# Patient Record
Sex: Female | Born: 1955
Health system: Southern US, Community
[De-identification: ages and names within clinical notes are randomized; demographics above are authoritative.]

## PROBLEM LIST (undated history)

## (undated) DIAGNOSIS — I10 Essential (primary) hypertension: Secondary | ICD-10-CM

## (undated) DIAGNOSIS — E079 Disorder of thyroid, unspecified: Secondary | ICD-10-CM

## (undated) DIAGNOSIS — A498 Other bacterial infections of unspecified site: Secondary | ICD-10-CM

## (undated) DIAGNOSIS — E785 Hyperlipidemia, unspecified: Secondary | ICD-10-CM

## (undated) HISTORY — DX: Other bacterial infections of unspecified site: A49.8

## (undated) HISTORY — DX: Essential (primary) hypertension: I10

## (undated) HISTORY — DX: Disorder of thyroid, unspecified: E07.9

## (undated) HISTORY — PX: ANKLE FRACTURE SURGERY: SHX122

## (undated) HISTORY — PX: CERVICAL CONE BIOPSY: SUR198

## (undated) HISTORY — DX: Hyperlipidemia, unspecified: E78.5

---

## 1997-08-19 ENCOUNTER — Other Ambulatory Visit: Admission: RE | Admit: 1997-08-19 | Discharge: 1997-08-19 | Payer: Self-pay | Admitting: Obstetrics & Gynecology

## 1998-02-10 ENCOUNTER — Other Ambulatory Visit: Admission: RE | Admit: 1998-02-10 | Discharge: 1998-02-10 | Payer: Self-pay | Admitting: Obstetrics & Gynecology

## 1998-08-12 ENCOUNTER — Other Ambulatory Visit: Admission: RE | Admit: 1998-08-12 | Discharge: 1998-08-12 | Payer: Self-pay | Admitting: Obstetrics & Gynecology

## 2000-12-13 ENCOUNTER — Other Ambulatory Visit: Admission: RE | Admit: 2000-12-13 | Discharge: 2000-12-13 | Payer: Self-pay | Admitting: Obstetrics & Gynecology

## 2003-03-06 ENCOUNTER — Other Ambulatory Visit: Admission: RE | Admit: 2003-03-06 | Discharge: 2003-03-06 | Payer: Self-pay | Admitting: Obstetrics & Gynecology

## 2004-06-01 ENCOUNTER — Other Ambulatory Visit: Admission: RE | Admit: 2004-06-01 | Discharge: 2004-06-01 | Payer: Self-pay | Admitting: Obstetrics & Gynecology

## 2005-08-22 ENCOUNTER — Ambulatory Visit: Payer: Self-pay | Admitting: Gastroenterology

## 2006-11-06 ENCOUNTER — Ambulatory Visit: Payer: Self-pay | Admitting: Gastroenterology

## 2007-08-18 DIAGNOSIS — E039 Hypothyroidism, unspecified: Secondary | ICD-10-CM

## 2007-08-18 DIAGNOSIS — F32A Depression, unspecified: Secondary | ICD-10-CM | POA: Insufficient documentation

## 2007-08-18 DIAGNOSIS — F329 Major depressive disorder, single episode, unspecified: Secondary | ICD-10-CM

## 2007-08-18 DIAGNOSIS — K219 Gastro-esophageal reflux disease without esophagitis: Secondary | ICD-10-CM

## 2007-10-30 ENCOUNTER — Telehealth: Payer: Self-pay | Admitting: Gastroenterology

## 2008-01-25 ENCOUNTER — Telehealth: Payer: Self-pay | Admitting: Gastroenterology

## 2008-02-25 ENCOUNTER — Ambulatory Visit: Payer: Self-pay | Admitting: Gastroenterology

## 2008-04-23 ENCOUNTER — Ambulatory Visit: Payer: Self-pay | Admitting: Gastroenterology

## 2009-12-22 ENCOUNTER — Encounter: Admission: RE | Admit: 2009-12-22 | Discharge: 2009-12-22 | Payer: Self-pay | Admitting: Orthopedic Surgery

## 2009-12-30 ENCOUNTER — Encounter
Admission: RE | Admit: 2009-12-30 | Discharge: 2010-03-30 | Payer: Self-pay | Source: Home / Self Care | Admitting: Orthopedic Surgery

## 2010-01-05 ENCOUNTER — Encounter: Admission: RE | Admit: 2010-01-05 | Discharge: 2010-01-05 | Payer: Self-pay | Admitting: Orthopedic Surgery

## 2010-01-19 ENCOUNTER — Encounter: Admission: RE | Admit: 2010-01-19 | Discharge: 2010-01-19 | Payer: Self-pay | Admitting: Orthopedic Surgery

## 2010-02-02 ENCOUNTER — Encounter: Admission: RE | Admit: 2010-02-02 | Discharge: 2010-02-02 | Payer: Self-pay | Admitting: Orthopedic Surgery

## 2010-03-03 ENCOUNTER — Encounter: Admission: RE | Admit: 2010-03-03 | Discharge: 2010-03-03 | Payer: Self-pay | Admitting: Orthopedic Surgery

## 2010-03-31 ENCOUNTER — Encounter
Admission: RE | Admit: 2010-03-31 | Discharge: 2010-04-29 | Payer: Self-pay | Source: Home / Self Care | Attending: Orthopedic Surgery | Admitting: Orthopedic Surgery

## 2010-04-06 ENCOUNTER — Encounter: Admission: RE | Admit: 2010-04-06 | Discharge: 2010-04-06 | Payer: Self-pay | Admitting: Orthopedic Surgery

## 2010-05-11 ENCOUNTER — Encounter
Admission: RE | Admit: 2010-05-11 | Discharge: 2010-05-31 | Payer: Self-pay | Source: Home / Self Care | Attending: Orthopedic Surgery | Admitting: Orthopedic Surgery

## 2010-05-23 ENCOUNTER — Encounter: Payer: Self-pay | Admitting: Orthopedic Surgery

## 2010-06-08 ENCOUNTER — Other Ambulatory Visit: Payer: Self-pay | Admitting: Orthopedic Surgery

## 2010-06-08 ENCOUNTER — Ambulatory Visit
Admission: RE | Admit: 2010-06-08 | Discharge: 2010-06-08 | Disposition: A | Discharge status: Home / Self Care | Payer: 59 | Source: Ambulatory Visit | Attending: Orthopedic Surgery | Admitting: Orthopedic Surgery

## 2010-06-08 DIAGNOSIS — S82892A Other fracture of left lower leg, initial encounter for closed fracture: Secondary | ICD-10-CM

## 2010-09-14 NOTE — Assessment & Plan Note (Signed)
[facility]                         GASTROENTEROLOGY OFFICE NOTE   LOAN, OGUIN                        MRN:          [id number]  DATE:11/06/2006                            DOB:          1955-05-29    Mrs. Mehrer returns for followup of GERD.  She states her reflux symptoms  have been under excellent control on Prevacid.  She recently ran out of  Prevacid and changed to Prilosec over the counter and since that times  she has had some mild problems with recurrent hoarseness.  She has no  dysphagia, odynophagia, chest pain, heartburn, belching or weight loss.  Further more she denies any change in bowel habits, change in stool  caliber, diarrhea, constipation, melena or hematochezia.  There is no  family history of colon cancer, colon polyps or inflammatory bowel  disease.   CURRENT MEDICATIONS:  Listed on the chart, updated and reviewed.   MEDICATION ALLERGIES:  None known.   EXAMINATION:  Well-developed, well-nourished, no acute distress.  Weight 152.8 pounds, blood pressure 120/70, pulse 64 and regular.  CHEST:  Clear to auscultation bilaterally.  CARDIAC:  Regular rate and rhythm without murmurs.  ABDOMEN:  Soft and nontender with normal active bowel sounds, no  palpable organomegaly, masses or hernias.   ASSESSMENT/PLAN:  1. Gastroesophageal reflux disease with presumed laryngopharyngeal      reflux (LPR).  Maintain all standard anti reflux measures.  Renew      Prevacid 30 mg p.o. q.a.m. for 1 year.  We did discuss Barrett's      esophagus and other complications of long term gastroesophageal      reflux disease and I have recommended performing upper endoscopy      sometime within the next 3-4 years.  She states she will consider      doing this at the time of her colonoscopy.  2. Colorectal cancer screening.  She is at average risk for colorectal      cancer.  We discussed the risks, benefits and alternatives to      colonoscopy with  possible biopsy and possible polypectomy and      although she does not want to schedule at this time she states that      she will do so within the next year.  She will call back to      reschedule and we will plan to proceed with an upper endoscopy for      Barrett's screening at the time of her colonoscopy.     [name]. [name], MD, [facility]  Electronically Signed    MTS/MedQ  DD: 11/06/2006  DT: 11/06/2006  Job #: [id number]

## 2010-09-24 LAB — HM COLONOSCOPY: HM Colonoscopy: NORMAL

## 2010-11-29 IMAGING — CR DG ANKLE COMPLETE 3+V*L*
3 series · 3 of 3 positions shown · non-contrast
Comparison: February 02, 2010

CLINICAL DATA: Left ankle fracture in October 2009 with surgery and
continued pain

LEFT ANKLE COMPLETE - 3+ VIEW

[view not recorded (1 of 3)]
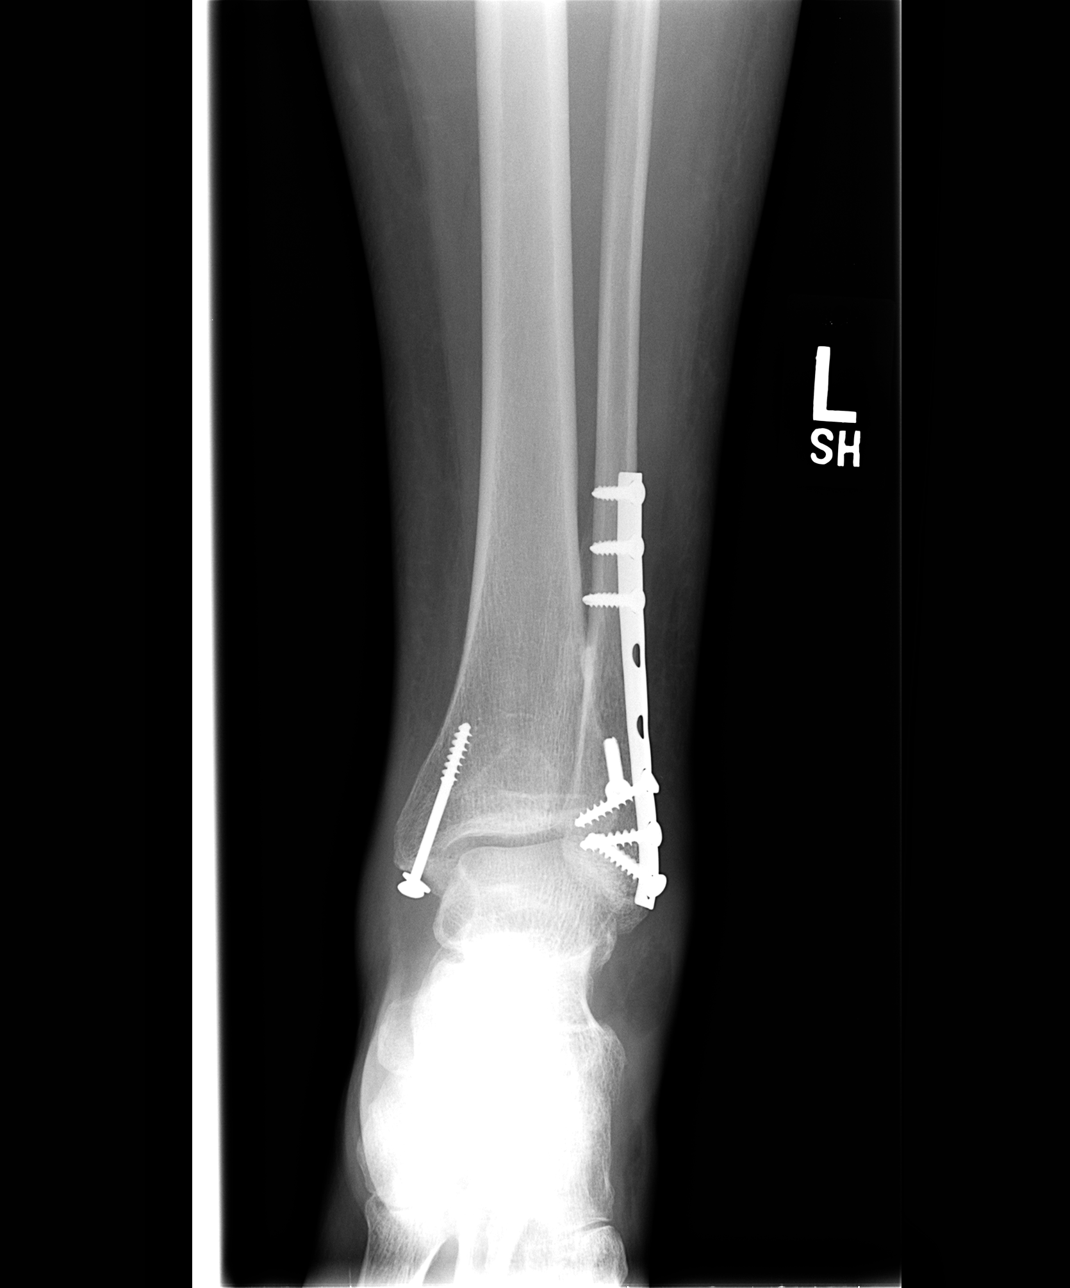

[view not recorded (2 of 3)]
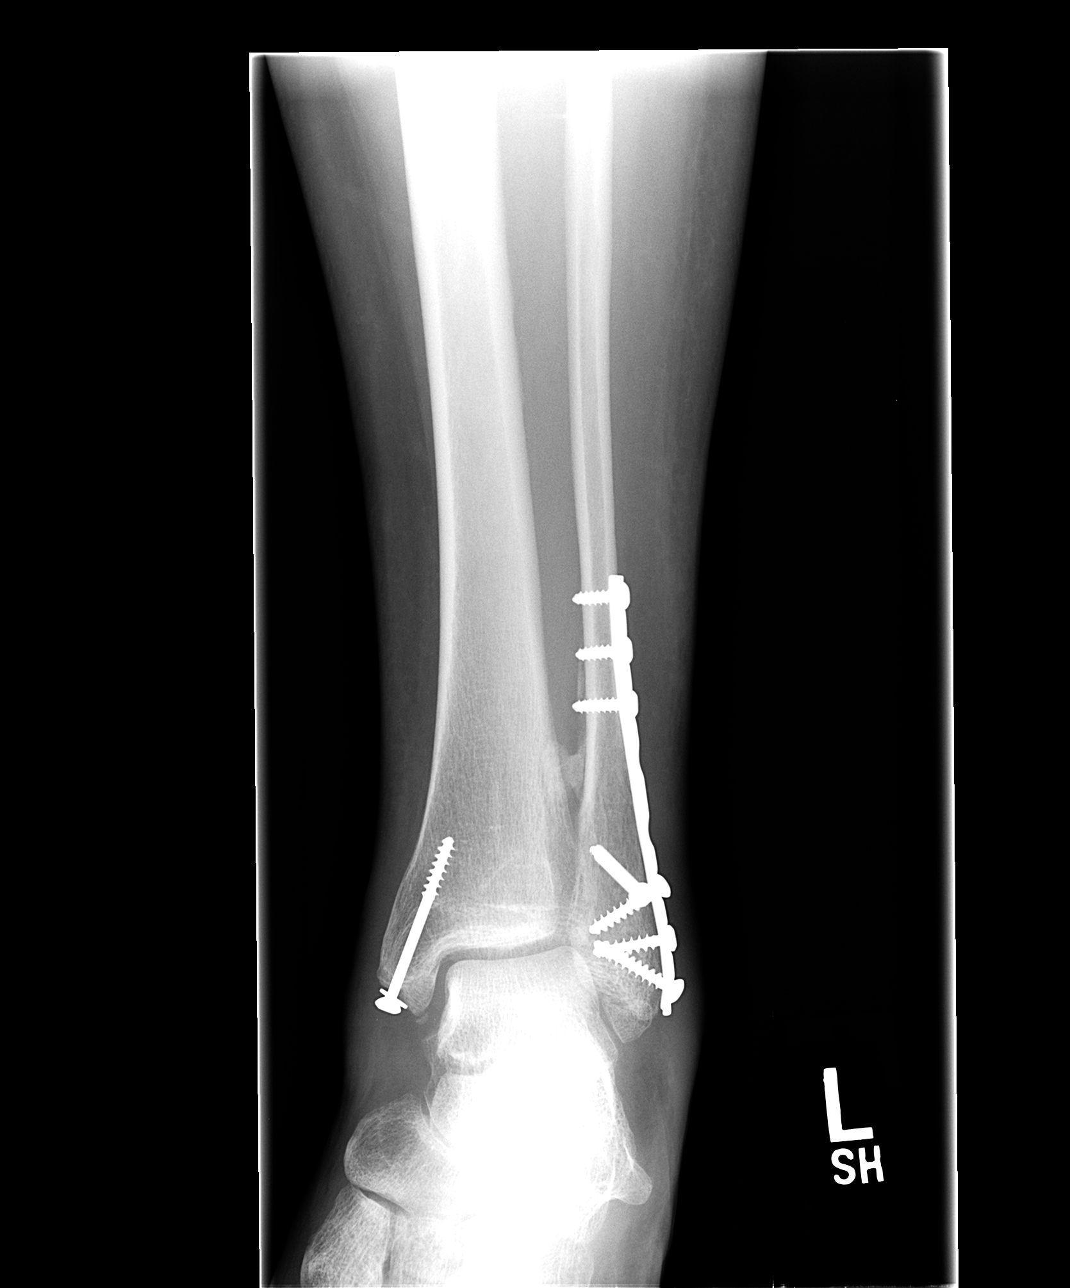

[view not recorded (3 of 3)]
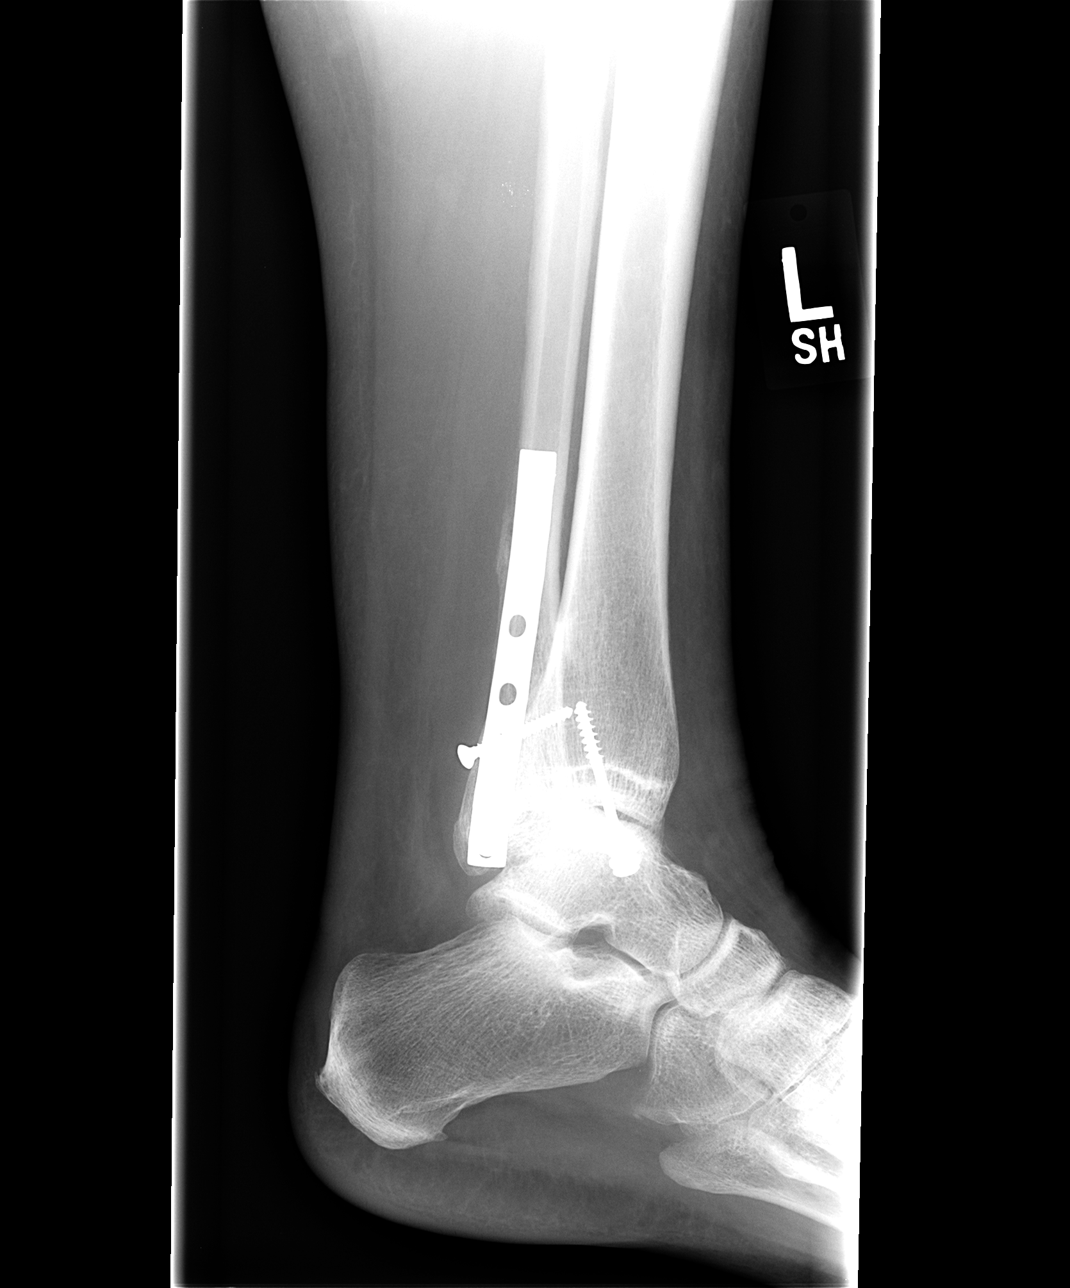

[3 of 3 positions shown; findings below may reference images not displayed]

FINDINGS: There is side plate and screw fixation of the distal left
tibia and long lag screw at the lateral malleolus.  There is no
peri hardware lucency to suggest loosening or infection.  The ankle
mortise is intact.  The fracture sites are anatomically aligned.
IMPRESSION: Stable appearance of ankle hardware and anatomic alignment of
tibial and fibular fractures.  There is no evidence of hardware
complication.

## 2010-12-21 ENCOUNTER — Ambulatory Visit
Admission: RE | Admit: 2010-12-21 | Discharge: 2010-12-21 | Disposition: A | Discharge status: Home / Self Care | Payer: 59 | Source: Ambulatory Visit | Attending: Orthopedic Surgery | Admitting: Orthopedic Surgery

## 2010-12-21 ENCOUNTER — Other Ambulatory Visit: Payer: Self-pay | Admitting: Orthopedic Surgery

## 2010-12-21 DIAGNOSIS — M25572 Pain in left ankle and joints of left foot: Secondary | ICD-10-CM

## 2011-04-15 ENCOUNTER — Ambulatory Visit: Payer: 59 | Admitting: Physical Therapy

## 2011-04-19 ENCOUNTER — Ambulatory Visit: Payer: 59 | Attending: Orthopedic Surgery | Admitting: Physical Therapy

## 2011-04-19 DIAGNOSIS — IMO0001 Reserved for inherently not codable concepts without codable children: Secondary | ICD-10-CM | POA: Insufficient documentation

## 2011-04-19 DIAGNOSIS — R262 Difficulty in walking, not elsewhere classified: Secondary | ICD-10-CM | POA: Insufficient documentation

## 2011-04-19 DIAGNOSIS — M25676 Stiffness of unspecified foot, not elsewhere classified: Secondary | ICD-10-CM | POA: Insufficient documentation

## 2011-04-19 DIAGNOSIS — M25673 Stiffness of unspecified ankle, not elsewhere classified: Secondary | ICD-10-CM | POA: Insufficient documentation

## 2011-04-19 DIAGNOSIS — M25579 Pain in unspecified ankle and joints of unspecified foot: Secondary | ICD-10-CM | POA: Insufficient documentation

## 2011-04-21 ENCOUNTER — Ambulatory Visit: Payer: 59 | Admitting: Physical Therapy

## 2011-04-27 ENCOUNTER — Ambulatory Visit: Payer: 59 | Admitting: Physical Therapy

## 2011-04-29 ENCOUNTER — Ambulatory Visit: Payer: 59 | Admitting: Physical Therapy

## 2011-05-02 ENCOUNTER — Ambulatory Visit: Payer: 59 | Admitting: Physical Therapy

## 2011-05-06 ENCOUNTER — Ambulatory Visit: Payer: 59 | Attending: Orthopedic Surgery | Admitting: Physical Therapy

## 2011-05-06 DIAGNOSIS — M25676 Stiffness of unspecified foot, not elsewhere classified: Secondary | ICD-10-CM | POA: Insufficient documentation

## 2011-05-06 DIAGNOSIS — IMO0001 Reserved for inherently not codable concepts without codable children: Secondary | ICD-10-CM | POA: Insufficient documentation

## 2011-05-06 DIAGNOSIS — M25673 Stiffness of unspecified ankle, not elsewhere classified: Secondary | ICD-10-CM | POA: Insufficient documentation

## 2011-05-06 DIAGNOSIS — M25579 Pain in unspecified ankle and joints of unspecified foot: Secondary | ICD-10-CM | POA: Insufficient documentation

## 2011-05-06 DIAGNOSIS — R262 Difficulty in walking, not elsewhere classified: Secondary | ICD-10-CM | POA: Insufficient documentation

## 2014-01-22 ENCOUNTER — Telehealth: Payer: Self-pay | Admitting: Cardiovascular Disease

## 2014-01-22 NOTE — Telephone Encounter (Signed)
Closed encounter °

## 2014-02-27 ENCOUNTER — Ambulatory Visit (INDEPENDENT_AMBULATORY_CARE_PROVIDER_SITE_OTHER): Payer: BC Managed Care – PPO | Admitting: Cardiovascular Disease

## 2014-02-27 ENCOUNTER — Encounter: Payer: Self-pay | Admitting: Cardiovascular Disease

## 2014-02-27 VITALS — BP 132/82 | HR 63 | Resp 16 | Ht 67.0 in | Wt 151.9 lb

## 2014-02-27 DIAGNOSIS — R0989 Other specified symptoms and signs involving the circulatory and respiratory systems: Secondary | ICD-10-CM

## 2014-02-27 DIAGNOSIS — E7800 Pure hypercholesterolemia, unspecified: Secondary | ICD-10-CM | POA: Insufficient documentation

## 2014-02-27 DIAGNOSIS — R0602 Shortness of breath: Secondary | ICD-10-CM

## 2014-02-27 DIAGNOSIS — R6889 Other general symptoms and signs: Secondary | ICD-10-CM

## 2014-02-27 NOTE — Assessment & Plan Note (Signed)
We'll check a treadmill stress test. This could be an angina equivalent, although I suspect it is more likely related to recent issues with thyroid supplementation, rather than coronary disease.

## 2014-02-27 NOTE — Assessment & Plan Note (Signed)
Her hypercholesterolemia is quite severe and I believe should be treated. She has elevated LDL particle number as well as elevated total LDL and has a family history of coronary disease. She is clearly biased against taking statins. She has never taken these medications and therefore has not had true side effects.  She is already eating a very healthy diet and exercises frequently. She is a healthy weight. I doubt that she would make a significant impact on her lipid profile with additional lifestyle changes. She probably has inherited heterozygous hypercholesterolemia.  Identifying any location of atherosclerotic disease would definitely lead to a firm recommendation for statin therapy and will also alter the target LDL level.

## 2014-02-27 NOTE — Progress Notes (Signed)
Patient ID: Kathleen [name], female   DOB: 19-Sep-1955, 58 y.o.   MRN: [id number]      Reason for office visit Hypercholesterolemia, reduced exercise tolerance  Kathleen [name] is a 58 year old woman without known cardiac or vascular disease but with long-standing problems with hypercholesterolemia and borderline diabetes mellitus. She has treated hypothyroidism with some recent difficulty in identifying the she has a family history of premature death from coronary disease. Her father died at age 57 from a myocardial infarction. Recently she has noticed reduced tolerance to exertion. She habitually walks almost daily but has noticed heaviness in her legs and reduced stamina. She has low energy. She has shortened her he walks from 3-4 miles down to only about 2 miles. She has noticed that she has horizontal earlobe creases, just like her father. She has not had any chest pain either at rest or with exertion and denies palpitations, syncope, intermittent claudication, focal neurological deficits or lower extremity edema. Her most recent TSH was normal.  She has known for a long time that she has an adverse lipid profile but has been resistant to taking statins. Total cholesterol 263, LDL 180, HDL 53, triglycerides 148, small LDL particle 698. Most recent hemoglobin A1c 5.8%. Creatinine 0.74, BUN 6, potassium 3.9, normal liver function tests. These labs are dated 01/21/2014  No Known Allergies  Current Outpatient Prescriptions  Medication Sig Dispense Refill  . ALPRAZolam (XANAX) 0.5 MG tablet Take 0.5 mg by mouth as needed.       . Cholecalciferol (VITAMIN D3) 5000 UNITS CAPS Take 5,000 Units by mouth daily.      Marland Kitchen. estradiol (CLIMARA - DOSED IN MG/24 HR) 0.075 mg/24hr patch       . levothyroxine (SYNTHROID, LEVOTHROID) 50 MCG tablet Take 50 mcg by mouth daily.       Marland Kitchen. NATURE-THROID 97.5 MG TABS daily.       . Omega-3 Fatty Acids (OMEGA-3 FISH OIL CONCENTRATE PO) Take 400 mg by mouth 2 (two) times daily.       . Probiotic Product (PROBIOTIC DAILY PO) Take by mouth 2 (two) times daily.      Marland Kitchen. PROGESTERONE MICRONIZED TD Take 200 mg by mouth daily.      . sertraline (ZOLOFT) 100 MG tablet Take 100 mg by mouth daily. 1 1/2 tablet daily       No current facility-administered medications for this visit.    Past Medical History  Diagnosis Date  . Hyperlipidemia   . Thyroid disease     Past Surgical History  Procedure Laterality Date  . Cervical cone biopsy    . Ankle fracture surgery      Family History  Problem Relation Age of Onset  . Heart disease Father     History   Social History  . Marital Status: Married    Spouse Name: N/A    Number of Children: N/A  . Years of Education: N/A   Occupational History  . Not on file.   Social History Main Topics  . Smoking status: Never Smoker   . Smokeless tobacco: Not on file  . Alcohol Use: No  . Drug Use: No  . Sexual Activity: Not on file   Other Topics Concern  . Not on file   Social History Narrative  . No narrative on file    Review of systems: The patient specifically denies any chest pain at rest or with exertion, dyspnea at rest or with exertion, orthopnea, paroxysmal nocturnal dyspnea, syncope, palpitations, focal neurological  deficits, intermittent claudication, lower extremity edema, unexplained weight gain, cough, hemoptysis or wheezing.  The patient also denies abdominal pain, nausea, vomiting, dysphagia, diarrhea, constipation, polyuria, polydipsia, dysuria, hematuria, frequency, urgency, abnormal bleeding or bruising, fever, chills, unexpected weight changes, mood swings, change in skin or hair texture, change in voice quality, auditory or visual problems, allergic reactions or rashes, new musculoskeletal complaints other than usual "aches and pains".   PHYSICAL EXAM BP 132/82  Pulse 63  Resp 16  Ht 5\' 7"  (1.702 m)  Wt 151 lb 14.4 oz (68.901 kg)  BMI 23.79 kg/m2  General: Alert, oriented x3, no  distress Head: no evidence of trauma, PERRL, EOMI, no exophtalmos or lid lag, no myxedema, no xanthelasma; normal ears, nose and oropharynx Neck: normal jugular venous pulsations and no hepatojugular reflux; brisk carotid pulses without delay she has a very distinct left carotid bruit but does not have a bruit on the right Chest: clear to auscultation, no signs of consolidation by percussion or palpation, normal fremitus, symmetrical and full respiratory excursions Cardiovascular: normal position and quality of the apical impulse, regular rhythm, normal first and second heart sounds, no murmurs, rubs or gallops Abdomen: no tenderness or distention, no masses by palpation, no abnormal pulsatility or arterial bruits, normal bowel sounds, no hepatosplenomegaly Extremities: no clubbing, cyanosis or edema; 2+ radial, ulnar and brachial pulses bilaterally; 2+ right femoral, posterior tibial and dorsalis pedis pulses; 2+ left femoral, posterior tibial and dorsalis pedis pulses; no subclavian or femoral bruits Neurological: grossly nonfocal   EKG: Normal sinus rhythm, normal tracing  Lipid Panel  Total cholesterol 263, LDL 180, HDL 53, triglycerides 148, small LDL particle 698. Most recent hemoglobin A1c 5.8%. Creatinine 0.74, BUN 6, potassium 3.9, normal liver function tests. These labs are dated 01/21/2014   ASSESSMENT AND PLAN  Orders Placed This Encounter  Procedures  . EKG 12-Lead  . Exercise Tolerance Test  . Doppler carotid   Meds ordered this encounter  Medications  . ALPRAZolam (XANAX) 0.5 MG tablet    Sig: Take 0.5 mg by mouth as needed.   Marland Kitchen. estradiol (CLIMARA - DOSED IN MG/24 HR) 0.075 mg/24hr patch    Sig:   . levothyroxine (SYNTHROID, LEVOTHROID) 50 MCG tablet    Sig: Take 50 mcg by mouth daily.   Marland Kitchen. NATURE-THROID 97.5 MG TABS    Sig: daily.   . sertraline (ZOLOFT) 100 MG tablet    Sig: Take 100 mg by mouth daily. 1 1/2 tablet daily  . PROGESTERONE MICRONIZED TD    Sig: Take  200 mg by mouth daily.  . Omega-3 Fatty Acids (OMEGA-3 FISH OIL CONCENTRATE PO)    Sig: Take 400 mg by mouth 2 (two) times daily.  . Probiotic Product (PROBIOTIC DAILY PO)    Sig: Take by mouth 2 (two) times daily.  . Cholecalciferol (VITAMIN D3) 5000 UNITS CAPS    Sig: Take 5,000 Units by mouth daily.    Junious SilkROITORU,Rheta Hemmelgarn  Tinley Rought, MD, [facility] [phone] office [phone] pager

## 2014-02-27 NOTE — Patient Instructions (Signed)
Your physician has requested that you have a carotid duplex. This test is an ultrasound of the carotid arteries in your neck. It looks at blood flow through these arteries that supply the brain with blood. Allow one hour for this exam. There are no restrictions or special instructions.  Your physician has requested that you have an exercise tolerance test. For further information please visit [email or web address]. Please also follow instruction sheet, as given.  Dr. Royann Shiversroitoru recommends that you schedule a follow-up appointment in: One year.

## 2014-03-18 ENCOUNTER — Telehealth (HOSPITAL_COMMUNITY): Payer: Self-pay

## 2014-03-18 NOTE — Telephone Encounter (Signed)
Encounter complete. 

## 2014-03-20 ENCOUNTER — Ambulatory Visit (HOSPITAL_COMMUNITY)
Admission: RE | Admit: 2014-03-20 | Discharge: 2014-03-20 | Disposition: A | Discharge status: Home / Self Care | Payer: BC Managed Care – PPO | Source: Ambulatory Visit | Attending: Cardiology | Admitting: Cardiology

## 2014-03-20 ENCOUNTER — Ambulatory Visit (HOSPITAL_BASED_OUTPATIENT_CLINIC_OR_DEPARTMENT_OTHER)
Admission: RE | Admit: 2014-03-20 | Discharge: 2014-03-20 | Disposition: A | Discharge status: Home / Self Care | Payer: BC Managed Care – PPO | Source: Ambulatory Visit | Attending: Cardiovascular Disease | Admitting: Cardiovascular Disease

## 2014-03-20 DIAGNOSIS — R0989 Other specified symptoms and signs involving the circulatory and respiratory systems: Secondary | ICD-10-CM | POA: Diagnosis present

## 2014-03-20 DIAGNOSIS — R0602 Shortness of breath: Secondary | ICD-10-CM

## 2014-03-20 NOTE — Procedures (Signed)
Exercise Treadmill Test Test  Exercise Tolerance Test Ordering MD: Thurmon FairMihai Croitoru, MD    Unique Test No: 1  Treadmill:  1  Indication for ETT: fatigue  Contraindication to ETT: No   Stress Modality: exercise - treadmill  Cardiac Imaging Performed: non   Protocol: standard Bruce - maximal  Max BP:  179/81  Max MPHR (bpm):  163 85% MPR (bpm):  139  MPHR obtained (bpm):  160 % MPHR obtained:  98  Reached 85% MPHR (min:sec):  8 Total Exercise Time (min-sec):  10  Workload in METS:  11.7 Borg Scale: 15  Reason ETT Terminated:  fatigue    ST Segment Analysis At Rest: NSR, RAD,no ST changes With Exercise: no evidence of significant ST depression  Other Information Arrhythmia:  No Angina during ETT:  absent (0) Quality of ETT:  diagnostic  ETT Interpretation:  normal - no evidence of ischemia by ST analysis  Comments: ETT with good exercise tolerance (10:00 min); normal BP response; no chest pain; no ST changes; negative adequate ETT.  Olga MillersBrian Crenshaw

## 2014-03-20 NOTE — Progress Notes (Signed)
Carotid Duplex Completed. °Brianna L Mazza,RVT °

## 2014-03-25 ENCOUNTER — Telehealth: Payer: Self-pay | Admitting: Cardiovascular Disease

## 2014-03-25 NOTE — Telephone Encounter (Signed)
Pt. Informed about her doppler results 

## 2014-03-25 NOTE — Telephone Encounter (Signed)
Pt was calling in wanting to know the results of her Carotid doppler done on 11/19. Please call  Thanks

## 2014-04-02 ENCOUNTER — Other Ambulatory Visit: Payer: Self-pay | Admitting: Obstetrics & Gynecology

## 2014-04-03 LAB — CYTOLOGY - PAP

## 2014-10-27 ENCOUNTER — Other Ambulatory Visit: Payer: Self-pay | Admitting: Obstetrics & Gynecology

## 2014-10-28 LAB — CYTOLOGY - PAP

## 2015-01-31 LAB — HM PAP SMEAR

## 2015-01-31 LAB — HM MAMMOGRAPHY: HM Mammogram: NORMAL (ref 0–4)

## 2015-03-20 ENCOUNTER — Encounter: Payer: Self-pay | Admitting: Cardiovascular Disease

## 2015-03-20 ENCOUNTER — Ambulatory Visit (INDEPENDENT_AMBULATORY_CARE_PROVIDER_SITE_OTHER): Payer: BLUE CROSS/BLUE SHIELD | Admitting: Cardiovascular Disease

## 2015-03-20 VITALS — BP 142/78 | HR 61 | Resp 16 | Ht 66.5 in | Wt 157.9 lb

## 2015-03-20 DIAGNOSIS — E7800 Pure hypercholesterolemia, unspecified: Secondary | ICD-10-CM | POA: Diagnosis not present

## 2015-03-20 DIAGNOSIS — R0989 Other specified symptoms and signs involving the circulatory and respiratory systems: Secondary | ICD-10-CM

## 2015-03-20 NOTE — Progress Notes (Signed)
Patient ID: Kathleen [name], female   DOB: January 24, 1956, 59 y.o.   MRN: [id number]     Cardiology Office Note   Date:  03/21/2015   ID:  Kathleen [name], DOB 04/25/1956, MRN [id number]  PCP:  Londell Moh, MD  Cardiologist:   Thurmon Fair, MD   Chief Complaint  Patient presents with  . Annual Exam    NO CARDIAC COMPLAINTS.  PCP wants her to start Crestor but she wants to discuss first.      History of Present Illness: Kathleen [name] is a 59 y.o. female who presents for  Follow-up for hyperlipidemia and family history of premature vascular disease. 3 of her sisters have fairly severely elevated LDL, as does Kathleen [name] , consistent with familial heterozygous hypercholesterolemia. She does not have any symptoms of cardiac disease. Dr. Renne Crigler prescribed Crestor 5 mg daily, but Kathleen [name] has not started taking it yet. She remains physically active and exercises regularly. She tries to eat a healthy diet.   a year ago, she exercised for 10 minutes on the Bruce protocol stress test, without symptoms or ECG changes. She has an asymptomatic left carotid bruit, Doppler ultrasonography showed only mild plaque.  Past Medical History  Diagnosis Date  . Hyperlipidemia   . Thyroid disease     Past Surgical History  Procedure Laterality Date  . Cervical cone biopsy    . Ankle fracture surgery       Current Outpatient Prescriptions  Medication Sig Dispense Refill  . ALPRAZolam (XANAX) 0.5 MG tablet Take 0.5 mg by mouth as needed.     Marland Kitchen aspirin 81 MG tablet Take 81 mg by mouth daily.    . Cholecalciferol (VITAMIN D3) 5000 UNITS CAPS Take 5,000 Units by mouth daily.    Marland Kitchen estradiol (CLIMARA - DOSED IN MG/24 HR) 0.075 mg/24hr patch Place 0.075 mg onto the skin 2 (two) times a week.     . estradiol (VIVELLE-DOT) 0.075 MG/24HR Place 1 patch onto the skin 2 (two) times a week.  1  . levothyroxine (SYNTHROID, LEVOTHROID) 75 MCG tablet Take 75 mcg by mouth daily.  1  . metFORMIN (GLUCOPHAGE) 500 MG  tablet Take 500 mg by mouth 2 (two) times daily with a meal.    . NATURE-THROID 97.5 MG TABS daily.     . Omega-3 Fatty Acids (OMEGA-3 FISH OIL CONCENTRATE PO) Take 400 mg by mouth 2 (two) times daily.    . Probiotic Product (PROBIOTIC DAILY PO) Take by mouth 2 (two) times daily.    Marland Kitchen PROGESTERONE MICRONIZED TD Take 200 mg by mouth daily.    . sertraline (ZOLOFT) 100 MG tablet Take 100 mg by mouth daily. 1 1/2 tablet daily     No current facility-administered medications for this visit.    Allergies:   Review of patient's allergies indicates no known allergies.    Social History:  The patient  reports that she has never smoked. She does not have any smokeless tobacco history on file. She reports that she does not drink alcohol or use illicit drugs.   Family History:  The patient's family history includes Deep vein thrombosis in her sister; Heart disease in her father.    ROS:  Please see the history of present illness.    Otherwise, review of systems positive for none.   All other systems are reviewed and negative.    PHYSICAL EXAM: VS:  BP 142/78 mmHg  Pulse 61  Resp 16  Ht 5' 6.5" (1.689 m)  Wt 157 lb  14.4 oz (71.623 kg)  BMI 25.11 kg/m2 , BMI Body mass index is 25.11 kg/(m^2).  General: Alert, oriented x3, no distress Head: no evidence of trauma, PERRL, EOMI, no exophtalmos or lid lag, no myxedema, no xanthelasma; normal ears, nose and oropharynx Neck: normal jugular venous pulsations and no hepatojugular reflux; brisk carotid pulses with faint left carotid bruit Chest: clear to auscultation, no signs of consolidation by percussion or palpation, normal fremitus, symmetrical and full respiratory excursions Cardiovascular: normal position and quality of the apical impulse, regular rhythm, normal first and second heart sounds, no murmurs, rubs or gallops Abdomen: no tenderness or distention, no masses by palpation, no abnormal pulsatility or arterial bruits, normal bowel sounds,  no hepatosplenomegaly Extremities: no clubbing, cyanosis or edema; 2+ radial, ulnar and brachial pulses bilaterally; 2+ right femoral, posterior tibial and dorsalis pedis pulses; 2+ left femoral, posterior tibial and dorsalis pedis pulses; no subclavian or femoral bruits Neurological: grossly nonfocal Psych: euthymic mood, full affect   EKG:  EKG is ordered today. The ekg ordered today demonstrates  Normal sinus rhythm, normal tracing, QTC 402 ms   Recent Labs:  labs drawn 02/19/2015  TSH 0.4, hemoglobin 13.1, creatinine 0.7, normal electrolytes and liver function tests  Lipid Panel  cholesterol 259, LDL 193, HDL 50, triglycerides 80  Wt Readings from Last 3 Encounters:  03/20/15 157 lb 14.4 oz (71.623 kg)  02/27/14 151 lb 14.4 oz (68.901 kg)  02/25/08 156 lb (70.761 kg)    .   ASSESSMENT AND PLAN:   Kathleen [name] has severely elevated LDL cholesterol despite regular physical exercise , healthy diet and weight close to the desirable range.   she has several questions about the purpose of statin therapy and potential side effects. We discussed this in detail. Also went over several supplements that had been recommended by her sister. I agree with Dr. Carolee RotaPharr's recommendation that she start treatment with a statin.  She will start doing so.  She understands that this does not give her license to stop exercising and eating healthy diet, but that these interventions are complementary.    Current medicines are reviewed at length with the patient today.  The patient has concerns regarding medicines.  The following changes have been made:   Start Crestor 5 mg daily as prescribed by Dr. Renne CriglerPharr  Labs/ tests ordered today include:  No orders of the defined types were placed in this encounter.     Patient Instructions  Dr. Royann Shiversroitoru recommends that you schedule a follow-up appointment in: ONE YEAR       SignedThurmon Fair, Yenesis Even, MD  03/21/2015 11:49 AM    Thurmon FairMihai Klein Willcox, MD, [facility] [phone] office [phone] pager

## 2015-03-20 NOTE — Patient Instructions (Signed)
Dr. Croitoru recommends that you schedule a follow-up appointment in: ONE YEAR   

## 2015-03-21 ENCOUNTER — Encounter: Payer: Self-pay | Admitting: Cardiovascular Disease

## 2015-08-24 DIAGNOSIS — Z08 Encounter for follow-up examination after completed treatment for malignant neoplasm: Secondary | ICD-10-CM | POA: Diagnosis not present

## 2015-08-24 DIAGNOSIS — Z85828 Personal history of other malignant neoplasm of skin: Secondary | ICD-10-CM | POA: Diagnosis not present

## 2015-09-24 ENCOUNTER — Encounter: Payer: Self-pay | Admitting: Family Medicine

## 2015-09-24 ENCOUNTER — Ambulatory Visit (INDEPENDENT_AMBULATORY_CARE_PROVIDER_SITE_OTHER): Payer: BLUE CROSS/BLUE SHIELD | Admitting: Family Medicine

## 2015-09-24 VITALS — BP 121/78 | HR 72 | Temp 98.2°F | Resp 16 | Ht 67.0 in | Wt 140.4 lb

## 2015-09-24 DIAGNOSIS — A047 Enterocolitis due to Clostridium difficile: Secondary | ICD-10-CM | POA: Diagnosis not present

## 2015-09-24 DIAGNOSIS — E7800 Pure hypercholesterolemia, unspecified: Secondary | ICD-10-CM | POA: Diagnosis not present

## 2015-09-24 DIAGNOSIS — F32A Depression, unspecified: Secondary | ICD-10-CM

## 2015-09-24 DIAGNOSIS — F329 Major depressive disorder, single episode, unspecified: Secondary | ICD-10-CM

## 2015-09-24 DIAGNOSIS — E038 Other specified hypothyroidism: Secondary | ICD-10-CM

## 2015-09-24 DIAGNOSIS — A0472 Enterocolitis due to Clostridium difficile, not specified as recurrent: Secondary | ICD-10-CM

## 2015-09-24 LAB — BASIC METABOLIC PANEL
BUN: 9 mg/dL (ref 6–23)
CHLORIDE: 101 meq/L (ref 96–112)
CO2: 27 mEq/L (ref 19–32)
Calcium: 9.6 mg/dL (ref 8.4–10.5)
Creatinine, Ser: 0.61 mg/dL (ref 0.40–1.20)
GFR: 106.61 mL/min (ref >60.00)
GLUCOSE: 86 mg/dL (ref 70–99)
POTASSIUM: 3.8 meq/L (ref 3.5–5.1)
SODIUM: 136 meq/L (ref 135–145)

## 2015-09-24 LAB — HEPATIC FUNCTION PANEL
ALBUMIN: 4.6 g/dL (ref 3.5–5.2)
ALT: 9 U/L (ref 0–35)
AST: 11 U/L (ref 0–37)
Alkaline Phosphatase: 55 U/L (ref 39–117)
Bilirubin, Direct: 0.1 mg/dL (ref 0.0–0.3)
Total Bilirubin: 0.4 mg/dL (ref 0.2–1.2)
Total Protein: 6.8 g/dL (ref 6.0–8.3)

## 2015-09-24 LAB — LIPID PANEL
CHOLESTEROL: 230 mg/dL — AB (ref 0–200)
HDL: 47 mg/dL (ref >39.00)
LDL CALC: 162 mg/dL — AB (ref 0–99)
NONHDL: 182.94
Total CHOL/HDL Ratio: 5
Triglycerides: 106 mg/dL (ref 0.0–149.0)
VLDL: 21.2 mg/dL (ref 0.0–40.0)

## 2015-09-24 LAB — TSH: TSH: 0.45 u[IU]/mL (ref 0.35–4.50)

## 2015-09-24 LAB — T4, FREE: Free T4: 0.75 ng/dL (ref 0.60–1.60)

## 2015-09-24 LAB — T3, FREE: T3 FREE: 3 pg/mL (ref 2.3–4.2)

## 2015-09-24 NOTE — Patient Instructions (Signed)
Schedule your complete physical in 6 months We'll notify you of your lab results and make any changes if needed Keep up the good work on healthy diet and regular exercise- you look great! Call with any questions or concerns Welcome!  We're glad to have you! Happy Memorial Day!

## 2015-09-24 NOTE — Progress Notes (Signed)
   Subjective:    Patient ID: Kathleen [name], female    DOB: [birth year], 60 y.o.   MRN: [id number]  HPI New to establish.  Previous MD- Pharr   GYN- Kathleen [name]  GI- Kathleen [name], changing to Dr Kathleen [name]  Hypothyroid- chronic problem, on Nature Thyroid [facility] and Levothyroxine.  Last TSH done 01/2015.  Denies excessive fatigue, changes to skin/hair/nails.  Hyperlipidemia- LDL was 193 in October, on Omega 3 twice daily but not on statin prescribed.  Pt is attempting to control w/ healthy diet and regular exercise.  Lost 15 lbs w/ recent C diff infxn.  Denies abd pain, N/V, myalgias.  + family hx of CAD.  Depression- chronic problem, on Zoloft 150mg  daily.  Pt feels sxs are well controlled, see Kathleen [name]   C diff- infxn x2.  Completed 2 courses of Flagyl.  Has new pt appt w/ Dr Kathleen [name].  Hx of underlying IBS.     Review of Systems  For ROS see HPI     Objective:   Physical Exam  Constitutional: She is oriented to person, place, and time. She appears well-developed and well-nourished. No distress.  HENT:  Head: Normocephalic and atraumatic.  Eyes: Conjunctivae and EOM are normal. Pupils are equal, round, and reactive to light.  Neck: Normal range of motion. Neck supple. No thyromegaly present.  Cardiovascular: Normal rate, regular rhythm, normal heart sounds and intact distal pulses.   No murmur heard. Pulmonary/Chest: Effort normal and breath sounds normal. No respiratory distress.  Abdominal: Soft. She exhibits no distension. There is no tenderness.  Musculoskeletal: She exhibits no edema.  Lymphadenopathy:    She has no cervical adenopathy.  Neurological: She is alert and oriented to person, place, and time.  Skin: Skin is warm and dry.  Psychiatric: She has a normal mood and affect. Her behavior is normal.  Vitals reviewed.         Assessment & Plan:

## 2015-09-24 NOTE — Progress Notes (Signed)
Pre visit review using our clinic review tool, if applicable. No additional management support is needed unless otherwise documented below in the visit note. 

## 2015-09-25 ENCOUNTER — Other Ambulatory Visit: Payer: Self-pay | Admitting: Family Medicine

## 2015-09-25 DIAGNOSIS — A0472 Enterocolitis due to Clostridium difficile, not specified as recurrent: Secondary | ICD-10-CM | POA: Insufficient documentation

## 2015-09-25 DIAGNOSIS — E785 Hyperlipidemia, unspecified: Secondary | ICD-10-CM

## 2015-09-25 NOTE — Assessment & Plan Note (Signed)
New to provider, ongoing for pt.  On both levothyroxine and Nature Thyroid.  Pt is currently asymptomatic.  Check labs.  Adjust meds prn.

## 2015-09-25 NOTE — Assessment & Plan Note (Signed)
New to provider, ongoing for pt.  She has had 2 separate episodes and completed 2 courses of Flagyl.  Has appt upcoming w/ GI.  Reports sxs have resolved at this time.  Will continue to monitor.

## 2015-09-25 NOTE — Assessment & Plan Note (Signed)
New to provider.  Ongoing for pt.  Seeing Ellis SavageLisa Poulos.  Feels sxs are currently well controlled.  Will follow along and assist as able.

## 2015-09-25 NOTE — Assessment & Plan Note (Signed)
New to provider, ongoing for pt.  She was prescribed a statin but elected not to start this as directed and attempt to focus on healthy diet and regular exercise instead.  Check labs.  Start statin if needed.  Pt expressed understanding and is in agreement w/ plan.

## 2015-10-19 ENCOUNTER — Ambulatory Visit (INDEPENDENT_AMBULATORY_CARE_PROVIDER_SITE_OTHER): Payer: BLUE CROSS/BLUE SHIELD | Admitting: Gastroenterology

## 2015-10-19 ENCOUNTER — Encounter: Payer: Self-pay | Admitting: Gastroenterology

## 2015-10-19 VITALS — BP 120/78 | HR 91 | Ht 67.0 in | Wt 138.0 lb

## 2015-10-19 DIAGNOSIS — A0472 Enterocolitis due to Clostridium difficile, not specified as recurrent: Secondary | ICD-10-CM

## 2015-10-19 DIAGNOSIS — A047 Enterocolitis due to Clostridium difficile: Secondary | ICD-10-CM

## 2015-10-19 DIAGNOSIS — K589 Irritable bowel syndrome without diarrhea: Secondary | ICD-10-CM

## 2015-10-19 NOTE — Patient Instructions (Signed)
Watch your intake of raw fruits and vegetables, milk and milk products, and high fat foods that can cause your diarrhea.   Thank you for choosing me and [facility].  Venita LickMalcolm T. Pleas KochStark, Jr., MD., Clementeen GrahamFACG

## 2015-10-19 NOTE — Progress Notes (Signed)
History of Present Illness: This is a 60 year old female referred by Sheliah Hatchabori, Katherine E, MD for the evaluation of recent C. difficile diarrhea. Patient reports the onset of severe diarrhea associated with nausea and weight loss following a course of clindamycin. C. diff toxin was negative and C diff pcr was positive. She was treated with a 10 day course of Flagyl and her symptoms resolved however all symptoms returned within a few weeks and she was treated with another 10 day course of Flagyl. Over the course of this time she lost about 20 pounds. Her diarrhea resolved and her bowel movements have been normal except when she eats raw fruits, raw vegetables, high fat foods and she notes stools that are more urgent and loose. She has a history of irritable bowel syndrome in the past with  urgent morning loose stools. The symptoms have been mild and she has not required medications. She was previously evaluated by me in 2009 and then switched her care to Dr. Ritta SlotJeff Medoff and underwent colonoscopy in December 2013. She reports the only finding was internal hemorrhoids which were treated by Dr. Kinnie ScalesMedoff. We are requesting records. Denies abdominal pain, constipation, change in stool caliber, melena, hematochezia, nausea, vomiting, dysphagia, reflux symptoms, chest pain.  Allergies  Allergen Reactions  . Clindamycin/Lincomycin Swelling    Gi problems and made throat close up after 7 days   Outpatient Prescriptions Prior to Visit  Medication Sig Dispense Refill  . ALPRAZolam (XANAX) 0.5 MG tablet Take 0.5 mg by mouth as needed.     . Cholecalciferol (VITAMIN D3) 5000 UNITS CAPS Take 5,000 Units by mouth daily. Reported on 09/24/2015    . estradiol (VIVELLE-DOT) 0.075 MG/24HR Place 1 patch onto the skin 2 (two) times a week.  1  . levothyroxine (SYNTHROID, LEVOTHROID) 75 MCG tablet Take 75 mcg by mouth daily.  1  . NATURE-THROID 97.5 MG TABS daily.     . Probiotic Product (PROBIOTIC DAILY PO) Take by mouth  2 (two) times daily.    . progesterone (PROMETRIUM) 100 MG capsule TK 1 C PO QHS  12  . rosuvastatin (CRESTOR) 5 MG tablet Take 5 mg by mouth daily.    Marland Kitchen. saccharomyces boulardii (FLORASTOR) 250 MG capsule Take 250 mg by mouth 2 (two) times daily.    . sertraline (ZOLOFT) 100 MG tablet Take 100 mg by mouth daily. 1 1/2 tablet daily    . aspirin 81 MG tablet Take 81 mg by mouth daily.    . metFORMIN (GLUCOPHAGE) 500 MG tablet Take 500 mg by mouth 2 (two) times daily with a meal. Reported on 09/24/2015    . Omega-3 Fatty Acids (OMEGA-3 FISH OIL CONCENTRATE PO) Take 400 mg by mouth 2 (two) times daily. Reported on 09/24/2015     No facility-administered medications prior to visit.   Past Medical History  Diagnosis Date  . Hyperlipidemia   . Thyroid disease   . Clostridium difficile infection    Past Surgical History  Procedure Laterality Date  . Cervical cone biopsy    . Ankle fracture surgery     Social History   Social History  . Marital Status: Married    Spouse Name: N/A  . Number of Children: N/A  . Years of Education: N/A   Social History Main Topics  . Smoking status: Never Smoker   . Smokeless tobacco: None  . Alcohol Use: No  . Drug Use: No  . Sexual Activity: Not Asked   Other Topics Concern  .  None   Social History Narrative   Family History  Problem Relation Age of Onset  . Heart disease Father   . Deep vein thrombosis Sister       Review of Systems: Pertinent positive and negative review of systems were noted in the above HPI section. All other review of systems were otherwise negative.   Physical Exam: General: Well developed, well nourished, no acute distress Head: Normocephalic and atraumatic Eyes:  sclerae anicteric, EOMI Ears: Normal auditory acuity Mouth: No deformity or lesions Neck: Supple, no masses or thyromegaly Lungs: Clear throughout to auscultation Heart: Regular rate and rhythm; no murmurs, rubs or bruits Abdomen: Soft, non tender and  non distended. No masses, hepatosplenomegaly or hernias noted. Normal Bowel sounds Musculoskeletal: Symmetrical with no gross deformities  Skin: No lesions on visible extremities Pulses:  Normal pulses noted Extremities: No clubbing, cyanosis, edema or deformities noted Neurological: Alert oriented x 4, grossly nonfocal Cervical Nodes:  No significant cervical adenopathy Inguinal Nodes: No significant inguinal adenopathy Psychological:  Alert and cooperative. Normal mood and affect  Assessment and Recommendations:  1. Recent history of relapsing C. difficile diarrhea with clinical resolution following 2 ten day courses of Flagyl. She now has an intolerance to raw fruits, raw vegetables and high fat foods. This is likely a postinfectious process or worsening of her IBS following acute GI infection. She is advised to avoid or minimize raw fruits, raw vegetables, milk products, high fat foods alcohol and caffeine. She may gradually reintroduce these into her diet over the next several months as tolerated. Continue Florastor bid for one more month. Resume Florastor bid during any future courses of antibiotics and continue for at least 1 month following completion of the antibiotic. We discussed treatment with vancomycin or Dificid if she has a relapse/recurrence of C. difficile.  2. CRC screening, average risk. Request records from Dr. Kinnie Scales. Tentatively plan for a 10 year interval surveillance colonoscopy in December 2023.   cc: Sheliah Hatch, MD [address] [place], [place] [address]

## 2015-10-27 DIAGNOSIS — E039 Hypothyroidism, unspecified: Secondary | ICD-10-CM | POA: Diagnosis not present

## 2015-10-27 DIAGNOSIS — E7800 Pure hypercholesterolemia, unspecified: Secondary | ICD-10-CM | POA: Diagnosis not present

## 2015-10-27 DIAGNOSIS — E28310 Symptomatic premature menopause: Secondary | ICD-10-CM | POA: Diagnosis not present

## 2015-10-27 DIAGNOSIS — E559 Vitamin D deficiency, unspecified: Secondary | ICD-10-CM | POA: Diagnosis not present

## 2015-11-09 ENCOUNTER — Other Ambulatory Visit: Payer: BLUE CROSS/BLUE SHIELD

## 2015-11-30 DIAGNOSIS — F3342 Major depressive disorder, recurrent, in full remission: Secondary | ICD-10-CM | POA: Diagnosis not present

## 2016-01-04 DIAGNOSIS — F419 Anxiety disorder, unspecified: Secondary | ICD-10-CM | POA: Diagnosis not present

## 2016-01-04 DIAGNOSIS — E039 Hypothyroidism, unspecified: Secondary | ICD-10-CM | POA: Diagnosis not present

## 2016-01-04 DIAGNOSIS — R197 Diarrhea, unspecified: Secondary | ICD-10-CM | POA: Diagnosis not present

## 2016-01-04 DIAGNOSIS — R112 Nausea with vomiting, unspecified: Secondary | ICD-10-CM | POA: Diagnosis not present

## 2016-01-08 ENCOUNTER — Telehealth: Payer: Self-pay | Admitting: Gastroenterology

## 2016-01-08 ENCOUNTER — Other Ambulatory Visit: Payer: BLUE CROSS/BLUE SHIELD

## 2016-01-08 DIAGNOSIS — R197 Diarrhea, unspecified: Secondary | ICD-10-CM

## 2016-01-08 NOTE — Telephone Encounter (Signed)
GI pathogen panel Please fax us all lab and other information she has from ED visit

## 2016-01-08 NOTE — Telephone Encounter (Signed)
Patient reports that while out of town this weekend at at football game she became violently ill after tail gaiting at the game.  Had several hours of vomiting and diarrhea.  Went to ED.  They diagnosed her with acute gastritis and sent her home after fluids.  She is having continued diarrhea after meals 3-4 times a day.  She reports that stools are semi formed to liquid.  She reports low grade fever.  This am T-max 99.5. She "has done extensive research and feels she has a recurrence of her c-diff and a very, very severe CDI infection"  based on the labs from the ED.  She has an abnormal CBC results from Satutday night /Sunday am available if you would like to see them..  She has not had any additional vomiting since ED discharge.  Please advise next step.  She does not want to be retreated with Vanc.  If she has c-diff she wants Dificid.  "I don't care about the cost".  Dr. Russella DarStark please advise

## 2016-01-08 NOTE — Telephone Encounter (Signed)
Patient notified She will have someone pick up container and bring copies of labs

## 2016-01-11 ENCOUNTER — Other Ambulatory Visit: Payer: BLUE CROSS/BLUE SHIELD

## 2016-01-11 DIAGNOSIS — R197 Diarrhea, unspecified: Secondary | ICD-10-CM | POA: Diagnosis not present

## 2016-01-12 LAB — GASTROINTESTINAL PATHOGEN PANEL PCR
C. difficile Tox A/B, PCR: NOT DETECTED
Campylobacter, PCR: NOT DETECTED
Cryptosporidium, PCR: NOT DETECTED
E COLI (ETEC) LT/ST, PCR: NOT DETECTED
E COLI (STEC) STX1/STX2, PCR: NOT DETECTED
E COLI 0157, PCR: NOT DETECTED
GIARDIA LAMBLIA, PCR: NOT DETECTED
NOROVIRUS, PCR: NOT DETECTED
Rotavirus A, PCR: NOT DETECTED
SALMONELLA, PCR: NOT DETECTED
SHIGELLA, PCR: NOT DETECTED

## 2016-01-15 ENCOUNTER — Ambulatory Visit (INDEPENDENT_AMBULATORY_CARE_PROVIDER_SITE_OTHER): Payer: BLUE CROSS/BLUE SHIELD | Admitting: Family Medicine

## 2016-01-15 ENCOUNTER — Encounter: Payer: Self-pay | Admitting: Family Medicine

## 2016-01-15 VITALS — BP 126/82 | HR 78 | Temp 97.8°F | Resp 16 | Ht 67.0 in | Wt 138.1 lb

## 2016-01-15 DIAGNOSIS — R197 Diarrhea, unspecified: Secondary | ICD-10-CM | POA: Diagnosis not present

## 2016-01-15 LAB — BASIC METABOLIC PANEL
BUN: 6 mg/dL (ref 6–23)
CALCIUM: 9.7 mg/dL (ref 8.4–10.5)
CHLORIDE: 98 meq/L (ref 96–112)
CO2: 26 meq/L (ref 19–32)
CREATININE: 0.7 mg/dL (ref 0.40–1.20)
GFR: 90.86 mL/min (ref >60.00)
GLUCOSE: 112 mg/dL — AB (ref 70–99)
Potassium: 4 mEq/L (ref 3.5–5.1)
Sodium: 136 mEq/L (ref 135–145)

## 2016-01-15 LAB — CBC WITH DIFFERENTIAL/PLATELET
BASOS ABS: 0 10*3/uL (ref 0.0–0.1)
Basophils Relative: 0.2 % (ref 0.0–3.0)
EOS ABS: 0.1 10*3/uL (ref 0.0–0.7)
Eosinophils Relative: 1.3 % (ref 0.0–5.0)
HCT: 42.9 % (ref 36.0–46.0)
Hemoglobin: 14.4 g/dL (ref 12.0–15.0)
LYMPHS ABS: 1.8 10*3/uL (ref 0.7–4.0)
Lymphocytes Relative: 24.3 % (ref 12.0–46.0)
MCHC: 33.7 g/dL (ref 30.0–36.0)
MCV: 91.1 fl (ref 78.0–100.0)
MONOS PCT: 6.9 % (ref 3.0–12.0)
Monocytes Absolute: 0.5 10*3/uL (ref 0.1–1.0)
NEUTROS ABS: 4.9 10*3/uL (ref 1.4–7.7)
NEUTROS PCT: 67.3 % (ref 43.0–77.0)
PLATELETS: 245 10*3/uL (ref 150.0–400.0)
RBC: 4.71 Mil/uL (ref 3.87–5.11)
RDW: 14.4 % (ref 11.5–15.5)
WBC: 7.3 10*3/uL (ref 4.0–10.5)

## 2016-01-15 LAB — HEPATIC FUNCTION PANEL
ALBUMIN: 4.7 g/dL (ref 3.5–5.2)
ALK PHOS: 49 U/L (ref 39–117)
ALT: 13 U/L (ref 0–35)
AST: 14 U/L (ref 0–37)
BILIRUBIN DIRECT: 0.1 mg/dL (ref 0.0–0.3)
TOTAL PROTEIN: 7.2 g/dL (ref 6.0–8.3)
Total Bilirubin: 0.5 mg/dL (ref 0.2–1.2)

## 2016-01-15 NOTE — Progress Notes (Signed)
Pre visit review using our clinic review tool, if applicable. No additional management support is needed unless otherwise documented below in the visit note. 

## 2016-01-15 NOTE — Patient Instructions (Signed)
Follow up as needed We'll notify you of your lab results and make any changes if needed Complete the stool studies as you are able Drink plenty of fluids!!! Start a SU[employer] diet- Bread/Bananas, Rice, Applesauce, Toast.  AVOID dairy as this is very hard for the gut to absorb in this setting Call with any questions or concerns Hang in there!!!

## 2016-01-15 NOTE — Progress Notes (Signed)
   Subjective:    Patient ID: Kathleen [name], female    DOB: [birth year], 60 y.o.   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  Daily diarrhea, no appetite, fatigue, low grade fevers (Tm 100).  Pt had C Diff in June and has had some difficulty normalizing diet since then.  sxs started very quickly, went to ER.  Pt reports up to 4 watery stools daily.  Pt reports sxs are worse after eating- is only eating saltines, ginger ale, rice, yogurt.  Pt is no longer vomiting, has not vomited since 9/3.  No abd pain, denies cramping.  Pt has hx of IBS.  Continues to take probiotic.  Not taking immodium.   Review of Systems For ROS see HPI     Objective:   Physical Exam  Constitutional: She is oriented to person, place, and time. She appears well-developed and well-nourished. No distress.  HENT:  Head: Normocephalic and atraumatic.  MMM  Neck: Neck supple.  Cardiovascular: Normal rate, regular rhythm and intact distal pulses.   Pulmonary/Chest: Effort normal and breath sounds normal. No respiratory distress. She has no wheezes. She has no rales.  Abdominal: Soft. She exhibits no distension. There is no tenderness. There is no rebound.  Hypoactive BS  Lymphadenopathy:    She has no cervical adenopathy.  Neurological: She is alert and oriented to person, place, and time.  Skin: Skin is warm and dry.  Psychiatric:  Pt is very angry today.  Has very negative things to say about all medical providers ([place] in [place], previous MD who started her on Clinda which 'caused' the initial C Diff, GI for not responding to her phone calls)          Assessment & Plan:  Diarrhea- I suspect that given her negative pathogen panel that this was an acute viral gastroenteritis that has now resulted in some malabsorptive diarrhea.  Encouraged pt to avoid dairy- she is currently eating yogurt quite a bit which may be worsening her sxs.  Repeat stool studies as  pt is adamant that this is how she presented when she first had C diff.  Assess electrolytes as these were indicative of dehydration at time of ER visit (low Na, low K+, low Cl).  Reviewed supportive care and red flags that should prompt return.  Pt expressed understanding and is in agreement w/ plan.

## 2016-01-18 ENCOUNTER — Encounter: Payer: Self-pay | Admitting: Family Medicine

## 2016-01-18 ENCOUNTER — Other Ambulatory Visit: Payer: Self-pay | Admitting: Family Medicine

## 2016-01-18 DIAGNOSIS — R197 Diarrhea, unspecified: Secondary | ICD-10-CM | POA: Diagnosis not present

## 2016-01-19 LAB — C. DIFFICILE GDH AND TOXIN A/B
C. DIFF TOXIN A/B: NOT DETECTED
C. DIFFICILE GDH: NOT DETECTED

## 2016-01-21 ENCOUNTER — Telehealth: Payer: Self-pay | Admitting: Family Medicine

## 2016-01-21 NOTE — Telephone Encounter (Signed)
Pt calling for lab results.

## 2016-01-22 LAB — STOOL CULTURE

## 2016-01-22 NOTE — Telephone Encounter (Signed)
Pt was called and message left on home machine per DPR.. Labs were also sent to pt on mychart.

## 2016-02-23 ENCOUNTER — Encounter: Payer: BLUE CROSS/BLUE SHIELD | Admitting: Family Medicine

## 2016-05-30 DIAGNOSIS — F330 Major depressive disorder, recurrent, mild: Secondary | ICD-10-CM | POA: Diagnosis not present

## 2016-05-30 DIAGNOSIS — L821 Other seborrheic keratosis: Secondary | ICD-10-CM | POA: Diagnosis not present

## 2016-07-20 ENCOUNTER — Ambulatory Visit (INDEPENDENT_AMBULATORY_CARE_PROVIDER_SITE_OTHER): Payer: BLUE CROSS/BLUE SHIELD | Admitting: Family Medicine

## 2016-07-20 ENCOUNTER — Encounter: Payer: Self-pay | Admitting: Family Medicine

## 2016-07-20 VITALS — BP 124/80 | HR 101 | Temp 98.1°F | Resp 16 | Ht 67.0 in | Wt 148.5 lb

## 2016-07-20 DIAGNOSIS — E038 Other specified hypothyroidism: Secondary | ICD-10-CM

## 2016-07-20 DIAGNOSIS — Z0000 Encounter for general adult medical examination without abnormal findings: Secondary | ICD-10-CM | POA: Diagnosis not present

## 2016-07-20 LAB — CBC WITH DIFFERENTIAL/PLATELET
Basophils Absolute: 0 10*3/uL (ref 0.0–0.1)
Basophils Relative: 0.6 % (ref 0.0–3.0)
EOS PCT: 1.8 % (ref 0.0–5.0)
Eosinophils Absolute: 0.1 10*3/uL (ref 0.0–0.7)
HCT: 41.6 % (ref 36.0–46.0)
Hemoglobin: 13.8 g/dL (ref 12.0–15.0)
Lymphocytes Relative: 19.2 % (ref 12.0–46.0)
Lymphs Abs: 1.1 10*3/uL (ref 0.7–4.0)
MCHC: 33.3 g/dL (ref 30.0–36.0)
MCV: 91 fl (ref 78.0–100.0)
MONOS PCT: 8.2 % (ref 3.0–12.0)
Monocytes Absolute: 0.5 10*3/uL (ref 0.1–1.0)
NEUTROS PCT: 70.2 % (ref 43.0–77.0)
Neutro Abs: 3.9 10*3/uL (ref 1.4–7.7)
Platelets: 193 10*3/uL (ref 150.0–400.0)
RBC: 4.57 Mil/uL (ref 3.87–5.11)
RDW: 13.9 % (ref 11.5–15.5)
WBC: 5.5 10*3/uL (ref 4.0–10.5)

## 2016-07-20 LAB — HEPATIC FUNCTION PANEL
ALT: 18 U/L (ref 0–35)
AST: 16 U/L (ref 0–37)
Albumin: 4.6 g/dL (ref 3.5–5.2)
Alkaline Phosphatase: 47 U/L (ref 39–117)
BILIRUBIN DIRECT: 0.1 mg/dL (ref 0.0–0.3)
TOTAL PROTEIN: 6.9 g/dL (ref 6.0–8.3)
Total Bilirubin: 0.7 mg/dL (ref 0.2–1.2)

## 2016-07-20 LAB — BASIC METABOLIC PANEL
BUN: 7 mg/dL (ref 6–23)
CALCIUM: 9.8 mg/dL (ref 8.4–10.5)
CO2: 29 meq/L (ref 19–32)
Chloride: 100 mEq/L (ref 96–112)
Creatinine, Ser: 0.56 mg/dL (ref 0.40–1.20)
GFR: 117.34 mL/min (ref >60.00)
GLUCOSE: 102 mg/dL — AB (ref 70–99)
Potassium: 4.5 mEq/L (ref 3.5–5.1)
SODIUM: 135 meq/L (ref 135–145)

## 2016-07-20 LAB — LIPID PANEL
CHOLESTEROL: 246 mg/dL — AB (ref 0–200)
HDL: 55.6 mg/dL (ref >39.00)
LDL Cholesterol: 171 mg/dL — ABNORMAL HIGH (ref 0–99)
NonHDL: 190.36
Total CHOL/HDL Ratio: 4
Triglycerides: 96 mg/dL (ref 0.0–149.0)
VLDL: 19.2 mg/dL (ref 0.0–40.0)

## 2016-07-20 LAB — VITAMIN D 25 HYDROXY (VIT D DEFICIENCY, FRACTURES): VITD: 38.32 ng/mL (ref 30.00–100.00)

## 2016-07-20 LAB — T4, FREE: Free T4: 1.09 ng/dL (ref 0.60–1.60)

## 2016-07-20 LAB — TSH: TSH: 0.07 u[IU]/mL — ABNORMAL LOW (ref 0.35–4.50)

## 2016-07-20 LAB — T3, FREE: T3 FREE: 4.3 pg/mL — AB (ref 2.3–4.2)

## 2016-07-20 NOTE — Progress Notes (Signed)
   Subjective:    Patient ID: Kathleen [name], female    DOB: July 16, 1955, [age] y.o.   MRN: [id number]  HPI CPE- UTD on GYN ([facility]-schedule for June), colonoscopy (due 2023- Dr Russella DarStark).     Review of Systems Patient reports no vision/ hearing changes, adenopathy,fever, weight change,  persistant/recurrent hoarseness , swallowing issues, chest pain, palpitations, edema, persistant/recurrent cough, hemoptysis, dyspnea (rest/exertional/paroxysmal nocturnal), gastrointestinal bleeding (melena, rectal bleeding), abdominal pain, significant heartburn, bowel changes, GU symptoms (dysuria, hematuria, incontinence), Gyn symptoms (abnormal  bleeding, pain),  syncope, focal weakness, memory loss, numbness & tingling, skin/hair/nail changes, abnormal bruising or bleeding, anxiety, or depression.     Objective:   Physical Exam General Appearance:    Alert, cooperative, no distress, appears older than stated age  Head:    Normocephalic, without obvious abnormality, atraumatic  Eyes:    PERRL, conjunctiva/corneas clear, EOM's intact, fundi    benign, both eyes  Ears:    Normal TM's and external ear canals, both ears  Nose:   Nares normal, septum midline, mucosa normal, no drainage    or sinus tenderness  Throat:   Lips, mucosa, and tongue normal; teeth and gums normal  Neck:   Supple, symmetrical, trachea midline, no adenopathy;    Thyroid: no enlargement/tenderness/nodules  Back:     Symmetric, no curvature, ROM normal, no CVA tenderness  Lungs:     Clear to auscultation bilaterally, respirations unlabored  Chest Wall:    No tenderness or deformity   Heart:    Regular rate and rhythm, S1 and S2 normal, no murmur, rub   or gallop  Breast Exam:    Deferred to GYN  Abdomen:     Soft, non-tender, bowel sounds active all four quadrants,    no masses, no organomegaly  Genitalia:    Deferred to GYN  Rectal:    Extremities:   Extremities normal, atraumatic, no cyanosis or edema  Pulses:   2+ and symmetric all  extremities  Skin:   Skin color, texture, turgor normal, no rashes or lesions  Lymph nodes:   Cervical, supraclavicular, and axillary nodes normal  Neurologic:   CNII-XII intact, normal strength, sensation and reflexes    throughout          Assessment & Plan:

## 2016-07-20 NOTE — Patient Instructions (Signed)
Follow up in 1 year or as needed We'll notify you of your lab results and make any changes if needed Keep up the good work on healthy diet and regular exercise- you look great! You are up to date on colonoscopy- yay! Ask Dr Jennette KettleNeal about your hormone replacement Call with any questions or concerns [place]!

## 2016-07-20 NOTE — Assessment & Plan Note (Signed)
Pt's PE WNL.  UTD on GYN, colonoscopy.  Pt is not sure of last Tdap date.  Instructed her to call if she were to cut herself.  Check labs.  Anticipatory guidance provided.

## 2016-07-20 NOTE — Progress Notes (Signed)
Pre visit review using our clinic review tool, if applicable. No additional management support is needed unless otherwise documented below in the visit note. 

## 2016-07-21 ENCOUNTER — Other Ambulatory Visit: Payer: Self-pay

## 2016-07-21 ENCOUNTER — Encounter: Payer: Self-pay | Admitting: Family Medicine

## 2016-07-21 MED ORDER — SIMVASTATIN 20 MG PO TABS: 20.0000 mg | ORAL_TABLET | Freq: Every day | ORAL | 3 refills | Status: DC

## 2016-10-10 DIAGNOSIS — N951 Menopausal and female climacteric states: Secondary | ICD-10-CM | POA: Diagnosis not present

## 2016-10-10 DIAGNOSIS — Z1231 Encounter for screening mammogram for malignant neoplasm of breast: Secondary | ICD-10-CM | POA: Diagnosis not present

## 2016-10-10 DIAGNOSIS — Z6823 Body mass index (BMI) 23.0-23.9, adult: Secondary | ICD-10-CM | POA: Diagnosis not present

## 2016-10-10 DIAGNOSIS — N958 Other specified menopausal and perimenopausal disorders: Secondary | ICD-10-CM | POA: Diagnosis not present

## 2016-10-10 DIAGNOSIS — Z01419 Encounter for gynecological examination (general) (routine) without abnormal findings: Secondary | ICD-10-CM | POA: Diagnosis not present

## 2016-10-10 DIAGNOSIS — Z13820 Encounter for screening for osteoporosis: Secondary | ICD-10-CM | POA: Diagnosis not present

## 2016-11-29 DIAGNOSIS — F3341 Major depressive disorder, recurrent, in partial remission: Secondary | ICD-10-CM | POA: Diagnosis not present

## 2017-03-30 DIAGNOSIS — Z23 Encounter for immunization: Secondary | ICD-10-CM | POA: Diagnosis not present

## 2017-04-03 DIAGNOSIS — R030 Elevated blood-pressure reading, without diagnosis of hypertension: Secondary | ICD-10-CM | POA: Diagnosis not present

## 2017-04-03 DIAGNOSIS — E039 Hypothyroidism, unspecified: Secondary | ICD-10-CM | POA: Diagnosis not present

## 2017-04-03 DIAGNOSIS — R0989 Other specified symptoms and signs involving the circulatory and respiratory systems: Secondary | ICD-10-CM | POA: Diagnosis not present

## 2017-04-03 DIAGNOSIS — E7800 Pure hypercholesterolemia, unspecified: Secondary | ICD-10-CM | POA: Diagnosis not present

## 2017-05-08 DIAGNOSIS — F3342 Major depressive disorder, recurrent, in full remission: Secondary | ICD-10-CM | POA: Diagnosis not present

## 2017-05-22 DIAGNOSIS — E039 Hypothyroidism, unspecified: Secondary | ICD-10-CM | POA: Diagnosis not present

## 2017-08-17 DIAGNOSIS — E039 Hypothyroidism, unspecified: Secondary | ICD-10-CM | POA: Diagnosis not present

## 2017-09-19 ENCOUNTER — Encounter: Payer: Self-pay | Admitting: General Practice

## 2017-12-06 ENCOUNTER — Other Ambulatory Visit (HOSPITAL_COMMUNITY): Payer: Self-pay | Admitting: Family Medicine

## 2017-12-06 DIAGNOSIS — R0989 Other specified symptoms and signs involving the circulatory and respiratory systems: Secondary | ICD-10-CM

## 2017-12-07 ENCOUNTER — Ambulatory Visit (HOSPITAL_COMMUNITY)
Admission: RE | Admit: 2017-12-07 | Discharge: 2017-12-07 | Disposition: A | Discharge status: Home / Self Care | Payer: BLUE CROSS/BLUE SHIELD | Source: Ambulatory Visit | Attending: Vascular Surgery | Admitting: Vascular Surgery

## 2017-12-07 DIAGNOSIS — R0989 Other specified symptoms and signs involving the circulatory and respiratory systems: Secondary | ICD-10-CM | POA: Diagnosis not present

## 2019-02-19 ENCOUNTER — Ambulatory Visit: Payer: BC Managed Care – PPO | Admitting: Cardiovascular Disease

## 2019-02-19 ENCOUNTER — Encounter: Payer: Self-pay | Admitting: Cardiovascular Disease

## 2019-02-19 ENCOUNTER — Other Ambulatory Visit: Payer: Self-pay

## 2019-02-19 VITALS — BP 162/82 | HR 70 | Ht 67.5 in | Wt 144.6 lb

## 2019-02-19 DIAGNOSIS — I10 Essential (primary) hypertension: Secondary | ICD-10-CM | POA: Diagnosis not present

## 2019-02-19 LAB — COMPREHENSIVE METABOLIC PANEL
ALT: 6 IU/L (ref 0–32)
AST: 11 IU/L (ref 0–40)
Albumin/Globulin Ratio: 2 (ref 1.2–2.2)
Albumin: 4.7 g/dL (ref 3.8–4.8)
Alkaline Phosphatase: 80 IU/L (ref 39–117)
BUN/Creatinine Ratio: 11 — ABNORMAL LOW (ref 12–28)
BUN: 8 mg/dL (ref 8–27)
Bilirubin Total: 0.5 mg/dL (ref 0.0–1.2)
CO2: 25 mmol/L (ref 20–29)
Calcium: 9.8 mg/dL (ref 8.7–10.3)
Chloride: 97 mmol/L (ref 96–106)
Creatinine, Ser: 0.73 mg/dL (ref 0.57–1.00)
GFR calc Af Amer: 102 mL/min/{1.73_m2} (ref >59)
GFR calc non Af Amer: 89 mL/min/{1.73_m2} (ref >59)
Globulin, Total: 2.3 g/dL (ref 1.5–4.5)
Glucose: 85 mg/dL (ref 65–99)
Potassium: 4.7 mmol/L (ref 3.5–5.2)
Sodium: 138 mmol/L (ref 134–144)
Total Protein: 7 g/dL (ref 6.0–8.5)

## 2019-02-19 LAB — LIPID PANEL
Chol/HDL Ratio: 4.9 ratio — ABNORMAL HIGH (ref 0.0–4.4)
Cholesterol, Total: 263 mg/dL — ABNORMAL HIGH (ref 100–199)
HDL: 54 mg/dL (ref >39)
LDL Chol Calc (NIH): 194 mg/dL — ABNORMAL HIGH (ref 0–99)
Triglycerides: 86 mg/dL (ref 0–149)
VLDL Cholesterol Cal: 15 mg/dL (ref 5–40)

## 2019-02-19 NOTE — Progress Notes (Signed)
Cardiology Office Note:    Date:  02/20/2019   ID:  Kathleen [name], DOB 1955/08/16, MRN [id number]  PCP:  Mosetta Putt, MD  Cardiologist:  Aubrielle Stroud Electrophysiologist:  None   Referring MD: Mosetta Putt, MD   Chief Complaint  Patient presents with  . Hyperlipidemia    History of Present Illness:    Kathleen [name] is a 63 y.o. female with a strong family history of premature coronary and vascular disease and a personal history of hyperlipidemia intolerant to numerous statins (she is tried atorvastatin, simvastatin and rosuvastatin without success.  She remains asymptomatic.  She walks for about 30 minutes 3-4 times a week and practices intermittent fasting.  She is quite lean with a BMI of 22.  Her blood pressure has been occasionally elevated typically around 150-160/90 at home, the lowest 135/80.  She is never had problems with diabetes mellitus and does not smoke.  She has occasional palpitations but has never experienced dizziness or syncope.  She denies orthopnea, PND, exertional dyspnea, angina at rest or with activity, lower extremity edema, claudication or focal neurological events.  We repeated her lipid profile today and it is consistent with heterozygous familial hypercholesterolemia.  Despite being very lean and physically active her total cholesterol 263 and LDL cholesterol is 194.  She eats a very healthy diet.  Past Medical History:  Diagnosis Date  . Clostridium difficile infection   . Hyperlipidemia   . Thyroid disease     Past Surgical History:  Procedure Laterality Date  . ANKLE FRACTURE SURGERY    . CERVICAL CONE BIOPSY      Current Medications: Current Meds  Medication Sig  . ALPRAZolam (XANAX) 0.5 MG tablet Take 0.5 mg by mouth as needed.   Marland Kitchen aspirin EC 81 MG tablet Take 81 mg by mouth daily.  . Cholecalciferol (VITAMIN D3) 5000 UNITS CAPS Take 5,000 Units by mouth daily. Reported on 09/24/2015  . estazolam (PROSOM) 2 MG tablet TK 1 T PO QHS PRN   . levothyroxine (SYNTHROID) 150 MCG tablet Take 150 mcg by mouth daily.   . sertraline (ZOLOFT) 100 MG tablet Take 100 mg by mouth daily. 1 1/2 tablet daily  . Zinc 50 MG TABS Take 1 tablet by mouth daily.     Allergies:   Clindamycin/lincomycin   Social History   Socioeconomic History  . Marital status: Married    Spouse name: Not on file  . Number of children: Not on file  . Years of education: Not on file  . Highest education level: Not on file  Occupational History  . Not on file  Social Needs  . Financial resource strain: Not on file  . Food insecurity    Worry: Not on file    Inability: Not on file  . Transportation needs    Medical: Not on file    Non-medical: Not on file  Tobacco Use  . Smoking status: Never Smoker  . Smokeless tobacco: Never Used  Substance and Sexual Activity  . Alcohol use: No    Alcohol/week: 0.0 standard drinks  . Drug use: No  . Sexual activity: Not on file  Lifestyle  . Physical activity    Days per week: Not on file    Minutes per session: Not on file  . Stress: Not on file  Relationships  . Social Musician on phone: Not on file    Gets together: Not on file    Attends religious service: Not on file  Active member of club or organization: Not on file    Attends meetings of clubs or organizations: Not on file    Relationship status: Not on file  Other Topics Concern  . Not on file  Social History Narrative  . Not on file     Family History: The patient's family history includes Deep vein thrombosis in her sister; Heart disease in her father.  ROS:   Please see the history of present illness.     All other systems reviewed and are negative.  EKGs/Labs/Other Studies Reviewed:    The following studies were reviewed today: n/a  EKG:  EKG is ordered today.  The ekg ordered today demonstrates normal sinus rhythm with T wave inversion in leads V1 and V2, no other ischemic abnormalities.  Recent Labs:  02/19/2019: ALT 6; BUN 8; Creatinine, Ser 0.73; Potassium 4.7; Sodium 138  Recent Lipid Panel    Component Value Date/Time   CHOL 263 (H) 02/19/2019 1040   TRIG 86 02/19/2019 1040   HDL 54 02/19/2019 1040   CHOLHDL 4.9 (H) 02/19/2019 1040   CHOLHDL 4 07/20/2016 0943   VLDL 19.2 07/20/2016 0943   LDLCALC 194 (H) 02/19/2019 1040    Physical Exam:    VS:  BP (!) 162/82   Pulse 70   Ht 5' 7.5" (1.715 m)   Wt 144 lb 9.6 oz (65.6 kg)   BMI 22.31 kg/m     Wt Readings from Last 3 Encounters:  02/19/19 144 lb 9.6 oz (65.6 kg)  07/20/16 148 lb 8 oz (67.4 kg)  01/15/16 138 lb 2 oz (62.7 kg)     GEN:  Well nourished, well developed in no acute distress HEENT: Normal NECK: No JVD; No carotid bruits LYMPHATICS: No lymphadenopathy CARDIAC: RRR, no murmurs, rubs, gallops RESPIRATORY:  Clear to auscultation without rales, wheezing or rhonchi  ABDOMEN: Soft, non-tender, non-distended MUSCULOSKELETAL:  No edema; No deformity  SKIN: Warm and dry NEUROLOGIC:  Alert and oriented x 3 PSYCHIATRIC:  Normal affect   ASSESSMENT:    1. Hypertension, unspecified type    PLAN:    In order of problems listed above:  1. HFH: I think her lipid profile and family history strongly suggestive of heterozygous familial hypercholesterolemia.  She has tried to take 3 different statins simvastatin, rosuvastatin and atorvastatin and has been intolerant of all of them due to severe myalgia.  I think she meets criteria for treatment with a PCSK9 inhibitor.  We will look into preauthorization with her insurance company. 2. HTN: This is a typically recent development.  Will focus initially on sodium restriction and also encouraged her to increase physical activity by about another extra 30-60 minutes every week.  She is already very lean.  Target blood pressure remains 130/80, may need antihypertensives.  Would be a good candidate for ACE inhibitor or angiotensin receptor blocker.   Medication  Adjustments/Labs and Tests Ordered: Current medicines are reviewed at length with the patient today.  Concerns regarding medicines are outlined above.  Orders Placed This Encounter  Procedures  . Comprehensive metabolic panel  . Lipid panel  . EKG 12-Lead   No orders of the defined types were placed in this encounter.   Patient Instructions  Medication Instructions:  No changes *If you need a refill on your cardiac medications before your next appointment, please call your pharmacy*  Lab Work: Your provider would like for you to have the following labs today: Lipid and CMET  If you have labs (blood  work) drawn today and your tests are completely normal, you will receive your results only by: Marland Kitchen MyChart Message (if you have MyChart) OR . A paper copy in the mail If you have any lab test that is abnormal or we need to change your treatment, we will call you to review the results.  Testing/Procedures: None ordered  Follow-Up: At [facility], you and your health needs are our priority.  As part of our continuing mission to provide you with exceptional heart care, we have created designated Provider Care Teams.  These Care Teams include your primary Cardiologist (physician) and Advanced Practice Providers (APPs -  Physician Assistants and Nurse Practitioners) who all work together to provide you with the care you need, when you need it.  Your next appointment:   3 months  The format for your next appointment:   Virtual Visit   Provider:   Sanda Klein, MD  Other Instructions Dr. Sallyanne Kuster would like you to check your blood pressure DAILY for the next few weeks.  Keep a journal of these daily BP and heart rate readings and call our office or send a message through [place] with the results.    Low-Sodium Eating Plan Sodium, which is an element that makes up salt, helps you maintain a healthy balance of fluids in your body. Too much sodium can increase your blood pressure and  cause fluid and waste to be held in your body. Your health care provider or dietitian may recommend following this plan if you have high blood pressure (hypertension), kidney disease, liver disease, or heart failure. Eating less sodium can help lower your blood pressure, reduce swelling, and protect your heart, liver, and kidneys. What are tips for following this plan? General guidelines  Most people on this plan should limit their sodium intake to 1,500-2,000 mg (milligrams) of sodium each day. Reading food labels   The Nutrition Facts label lists the amount of sodium in one serving of the food. If you eat more than one serving, you must multiply the listed amount of sodium by the number of servings.  Choose foods with less than 140 mg of sodium per serving.  Avoid foods with 300 mg of sodium or more per serving. Shopping  Look for lower-sodium products, often labeled as "low-sodium" or "no salt added."  Always check the sodium content even if foods are labeled as "unsalted" or "no salt added".  Buy fresh foods. ? Avoid canned foods and premade or frozen meals. ? Avoid canned, cured, or processed meats  Buy breads that have less than 80 mg of sodium per slice. Cooking  Eat more home-cooked food and less restaurant, buffet, and fast food.  Avoid adding salt when cooking. Use salt-free seasonings or herbs instead of table salt or sea salt. Check with your health care provider or pharmacist before using salt substitutes.  Cook with plant-based oils, such as canola, sunflower, or olive oil. Meal planning  When eating at a restaurant, ask that your food be prepared with less salt or no salt, if possible.  Avoid foods that contain MSG (monosodium glutamate). MSG is sometimes added to [place] food, bouillon, and some canned foods. What foods are recommended? The items listed may not be a complete list. Talk with your dietitian about what dietary choices are best for you. Grains  Low-sodium cereals, including oats, puffed wheat and rice, and shredded wheat. Low-sodium crackers. Unsalted rice. Unsalted pasta. Low-sodium bread. Whole-grain breads and whole-grain pasta. Vegetables Fresh or frozen vegetables. "No  salt added" canned vegetables. "No salt added" tomato sauce and paste. Low-sodium or reduced-sodium tomato and vegetable juice. Fruits Fresh, frozen, or canned fruit. Fruit juice. Meats and other protein foods Fresh or frozen (no salt added) meat, poultry, seafood, and fish. Low-sodium canned tuna and salmon. Unsalted nuts. Dried peas, beans, and lentils without added salt. Unsalted canned beans. Eggs. Unsalted nut butters. Dairy Milk. Soy milk. Cheese that is naturally low in sodium, such as ricotta cheese, fresh mozzarella, or Swiss cheese Low-sodium or reduced-sodium cheese. Cream cheese. Yogurt. Fats and oils Unsalted butter. Unsalted margarine with no trans fat. Vegetable oils such as canola or olive oils. Seasonings and other foods Fresh and dried herbs and spices. Salt-free seasonings. Low-sodium mustard and ketchup. Sodium-free salad dressing. Sodium-free light mayonnaise. Fresh or refrigerated horseradish. Lemon juice. Vinegar. Homemade, reduced-sodium, or low-sodium soups. Unsalted popcorn and pretzels. Low-salt or salt-free chips. What foods are not recommended? The items listed may not be a complete list. Talk with your dietitian about what dietary choices are best for you. Grains Instant hot cereals. Bread stuffing, pancake, and biscuit mixes. Croutons. Seasoned rice or pasta mixes. Noodle soup cups. Boxed or frozen macaroni and cheese. Regular salted crackers. Self-rising flour. Vegetables Sauerkraut, pickled vegetables, and relishes. Olives. [place] fries. Onion rings. Regular canned vegetables (not low-sodium or reduced-sodium). Regular canned tomato sauce and paste (not low-sodium or reduced-sodium). Regular tomato and vegetable juice (not low-sodium  or reduced-sodium). Frozen vegetables in sauces. Meats and other protein foods Meat or fish that is salted, canned, smoked, spiced, or pickled. Bacon, ham, sausage, hotdogs, corned beef, chipped beef, packaged lunch meats, salt pork, jerky, pickled herring, anchovies, regular canned tuna, sardines, salted nuts. Dairy Processed cheese and cheese spreads. Cheese curds. Blue cheese. Feta cheese. String cheese. Regular cottage cheese. Buttermilk. Canned milk. Fats and oils Salted butter. Regular margarine. Ghee. Bacon fat. Seasonings and other foods Onion salt, garlic salt, seasoned salt, table salt, and sea salt. Canned and packaged gravies. Worcestershire sauce. Tartar sauce. Barbecue sauce. Teriyaki sauce. Soy sauce, including reduced-sodium. Steak sauce. Fish sauce. Oyster sauce. Cocktail sauce. Horseradish that you find on the shelf. Regular ketchup and mustard. Meat flavorings and tenderizers. Bouillon cubes. Hot sauce and Tabasco sauce. Premade or packaged marinades. Premade or packaged taco seasonings. Relishes. Regular salad dressings. Salsa. Potato and tortilla chips. Corn chips and puffs. Salted popcorn and pretzels. Canned or dried soups. Pizza. Frozen entrees and pot pies. Summary  Eating less sodium can help lower your blood pressure, reduce swelling, and protect your heart, liver, and kidneys.  Most people on this plan should limit their sodium intake to 1,500-2,000 mg (milligrams) of sodium each day.  Canned, boxed, and frozen foods are high in sodium. Restaurant foods, fast foods, and pizza are also very high in sodium. You also get sodium by adding salt to food.  Try to cook at home, eat more fresh fruits and vegetables, and eat less fast food, canned, processed, or prepared foods. This information is not intended to replace advice given to you by your health care provider. Make sure you discuss any questions you have with your health care provider. Document Released: 10/08/2001  Document Revised: 03/31/2017 Document Reviewed: 04/11/2016 Elsevier Patient Education  2020 [employer].       Signed, Thurmon Fair, MD  02/20/2019 5:46 PM    [facility] '

## 2019-02-19 NOTE — Patient Instructions (Signed)
Medication Instructions:  No changes *If you need a refill on your cardiac medications before your next appointment, please call your pharmacy*  Lab Work: Your provider would like for you to have the following labs today: Lipid and CMET  If you have labs (blood work) drawn today and your tests are completely normal, you will receive your results only by:  MyChart Message (if you have MyChart) OR  A paper copy in the mail If you have any lab test that is abnormal or we need to change your treatment, we will call you to review the results.  Testing/Procedures: None ordered  Follow-Up: At [facility], you and your health needs are our priority.  As part of our continuing mission to provide you with exceptional heart care, we have created designated Provider Care Teams.  These Care Teams include your primary Cardiologist (physician) and Advanced Practice Providers (APPs -  Physician Assistants and Nurse Practitioners) who all work together to provide you with the care you need, when you need it.  Your next appointment:   3 months  The format for your next appointment:   Virtual Visit   Provider:   Thurmon Fair, MD  Other Instructions Dr. Royann Shivers would like you to check your blood pressure DAILY for the next few weeks.  Keep a journal of these daily BP and heart rate readings and call our office or send a message through MyChart with the results.    Low-Sodium Eating Plan Sodium, which is an element that makes up salt, helps you maintain a healthy balance of fluids in your body. Too much sodium can increase your blood pressure and cause fluid and waste to be held in your body. Your health care provider or dietitian may recommend following this plan if you have high blood pressure (hypertension), kidney disease, liver disease, or heart failure. Eating less sodium can help lower your blood pressure, reduce swelling, and protect your heart, liver, and kidneys. What are tips for  following this plan? General guidelines  Most people on this plan should limit their sodium intake to 1,500-2,000 mg (milligrams) of sodium each day. Reading food labels   The Nutrition Facts label lists the amount of sodium in one serving of the food. If you eat more than one serving, you must multiply the listed amount of sodium by the number of servings.  Choose foods with less than 140 mg of sodium per serving.  Avoid foods with 300 mg of sodium or more per serving. Shopping  Look for lower-sodium products, often labeled as "low-sodium" or "no salt added."  Always check the sodium content even if foods are labeled as "unsalted" or "no salt added".  Buy fresh foods. ? Avoid canned foods and premade or frozen meals. ? Avoid canned, cured, or processed meats  Buy breads that have less than 80 mg of sodium per slice. Cooking  Eat more home-cooked food and less restaurant, buffet, and fast food.  Avoid adding salt when cooking. Use salt-free seasonings or herbs instead of table salt or sea salt. Check with your health care provider or pharmacist before using salt substitutes.  Cook with plant-based oils, such as canola, sunflower, or olive oil. Meal planning  When eating at a restaurant, ask that your food be prepared with less salt or no salt, if possible.  Avoid foods that contain MSG (monosodium glutamate). MSG is sometimes added to [place] food, bouillon, and some canned foods. What foods are recommended? The items listed may not be a  complete list. Talk with your dietitian about what dietary choices are best for you. Grains Low-sodium cereals, including oats, puffed wheat and rice, and shredded wheat. Low-sodium crackers. Unsalted rice. Unsalted pasta. Low-sodium bread. Whole-grain breads and whole-grain pasta. Vegetables Fresh or frozen vegetables. "No salt added" canned vegetables. "No salt added" tomato sauce and paste. Low-sodium or reduced-sodium tomato and vegetable  juice. Fruits Fresh, frozen, or canned fruit. Fruit juice. Meats and other protein foods Fresh or frozen (no salt added) meat, poultry, seafood, and fish. Low-sodium canned tuna and salmon. Unsalted nuts. Dried peas, beans, and lentils without added salt. Unsalted canned beans. Eggs. Unsalted nut butters. Dairy Milk. Soy milk. Cheese that is naturally low in sodium, such as ricotta cheese, fresh mozzarella, or Swiss cheese Low-sodium or reduced-sodium cheese. Cream cheese. Yogurt. Fats and oils Unsalted butter. Unsalted margarine with no trans fat. Vegetable oils such as canola or olive oils. Seasonings and other foods Fresh and dried herbs and spices. Salt-free seasonings. Low-sodium mustard and ketchup. Sodium-free salad dressing. Sodium-free light mayonnaise. Fresh or refrigerated horseradish. Lemon juice. Vinegar. Homemade, reduced-sodium, or low-sodium soups. Unsalted popcorn and pretzels. Low-salt or salt-free chips. What foods are not recommended? The items listed may not be a complete list. Talk with your dietitian about what dietary choices are best for you. Grains Instant hot cereals. Bread stuffing, pancake, and biscuit mixes. Croutons. Seasoned rice or pasta mixes. Noodle soup cups. Boxed or frozen macaroni and cheese. Regular salted crackers. Self-rising flour. Vegetables Sauerkraut, pickled vegetables, and relishes. Olives. [place] fries. Onion rings. Regular canned vegetables (not low-sodium or reduced-sodium). Regular canned tomato sauce and paste (not low-sodium or reduced-sodium). Regular tomato and vegetable juice (not low-sodium or reduced-sodium). Frozen vegetables in sauces. Meats and other protein foods Meat or fish that is salted, canned, smoked, spiced, or pickled. Bacon, ham, sausage, hotdogs, corned beef, chipped beef, packaged lunch meats, salt pork, jerky, pickled herring, anchovies, regular canned tuna, sardines, salted nuts. Dairy Processed cheese and cheese spreads.  Cheese curds. Blue cheese. Feta cheese. String cheese. Regular cottage cheese. Buttermilk. Canned milk. Fats and oils Salted butter. Regular margarine. Ghee. Bacon fat. Seasonings and other foods Onion salt, garlic salt, seasoned salt, table salt, and sea salt. Canned and packaged gravies. Worcestershire sauce. Tartar sauce. Barbecue sauce. Teriyaki sauce. Soy sauce, including reduced-sodium. Steak sauce. Fish sauce. Oyster sauce. Cocktail sauce. Horseradish that you find on the shelf. Regular ketchup and mustard. Meat flavorings and tenderizers. Bouillon cubes. Hot sauce and Tabasco sauce. Premade or packaged marinades. Premade or packaged taco seasonings. Relishes. Regular salad dressings. Salsa. Potato and tortilla chips. Corn chips and puffs. Salted popcorn and pretzels. Canned or dried soups. Pizza. Frozen entrees and pot pies. Summary  Eating less sodium can help lower your blood pressure, reduce swelling, and protect your heart, liver, and kidneys.  Most people on this plan should limit their sodium intake to 1,500-2,000 mg (milligrams) of sodium each day.  Canned, boxed, and frozen foods are high in sodium. Restaurant foods, fast foods, and pizza are also very high in sodium. You also get sodium by adding salt to food.  Try to cook at home, eat more fresh fruits and vegetables, and eat less fast food, canned, processed, or prepared foods. This information is not intended to replace advice given to you by your health care provider. Make sure you discuss any questions you have with your health care provider. Document Released: 10/08/2001 Document Revised: 03/31/2017 Document Reviewed: 04/11/2016 Elsevier Patient Education  2020 [employer].

## 2019-02-20 ENCOUNTER — Encounter: Payer: Self-pay | Admitting: Cardiovascular Disease

## 2019-02-22 ENCOUNTER — Telehealth: Payer: Self-pay | Admitting: Cardiovascular Disease

## 2019-02-22 NOTE — Telephone Encounter (Signed)
Pt called our office to discuss lab results

## 2019-02-22 NOTE — Telephone Encounter (Signed)
Patient was calling Kathleen [name] back, she has already spoken with her

## 2019-02-26 ENCOUNTER — Telehealth: Payer: Self-pay | Admitting: Cardiovascular Disease

## 2019-02-26 NOTE — Telephone Encounter (Signed)
  Kathleen [name] is calling to get some clarification on the authorization that was requested for the patients Repatha

## 2019-02-26 NOTE — Telephone Encounter (Signed)
Called and spoke w/pt regarding repatha and how its adminstered and the pt voiced understanding. Instructed the pt to await my call as for when the rx will be sent to pharmacy b/c the pa is currently waiting for a determination

## 2019-02-26 NOTE — Telephone Encounter (Signed)
Will route to CMA who helps with the auth for repatha.

## 2019-02-28 ENCOUNTER — Telehealth: Payer: Self-pay | Admitting: Cardiovascular Disease

## 2019-02-28 NOTE — Telephone Encounter (Signed)
Attempted to call back and was informed via voicemail message that the department was closed today and to leave a message.

## 2019-02-28 NOTE — Telephone Encounter (Signed)
New Message     Clarification on a question regarding Repatha    Please call back

## 2019-02-28 NOTE — Telephone Encounter (Signed)
Called and spoke w/pt regarding waiting on a determination for the repatha pa and I instructed the pt to await my call b/c I will call as soon as an answer comes back so that we can get the ball rolling. Pt voiced understanding.

## 2019-03-08 ENCOUNTER — Telehealth: Payer: Self-pay | Admitting: Cardiovascular Disease

## 2019-03-08 NOTE — Telephone Encounter (Signed)
Will route to PharmD.  

## 2019-03-08 NOTE — Telephone Encounter (Signed)
Patient would like to speak to someone about a medication denial letter she received from insurance company for [employer]

## 2019-03-11 MED ORDER — REPATHA SURECLICK 140 MG/ML ~~LOC~~ SOAJ: 140.0000 mg | SUBCUTANEOUS | 6 refills | Status: DC

## 2019-03-11 NOTE — Telephone Encounter (Signed)
Called and lmomed the pt stating that they are approved for the repatha and the rx sent to the pharmacy on file. Instructed the pt to call if to unaffordable

## 2019-05-31 ENCOUNTER — Encounter: Payer: Self-pay | Admitting: Cardiovascular Disease

## 2019-05-31 ENCOUNTER — Telehealth (INDEPENDENT_AMBULATORY_CARE_PROVIDER_SITE_OTHER): Payer: BC Managed Care – PPO | Admitting: Cardiovascular Disease

## 2019-05-31 ENCOUNTER — Telehealth: Payer: Self-pay | Admitting: *Deleted

## 2019-05-31 DIAGNOSIS — E7801 Familial hypercholesterolemia: Secondary | ICD-10-CM

## 2019-05-31 NOTE — Patient Instructions (Signed)

## 2019-05-31 NOTE — Telephone Encounter (Signed)

## 2019-05-31 NOTE — Progress Notes (Signed)
Virtual Visit via Telephone Note   This visit type was conducted due to national recommendations for restrictions regarding the COVID-19 Pandemic (e.g. social distancing) in an effort to limit this patient's exposure and mitigate transmission in our community.  Due to her co-morbid illnesses, this patient is at least at moderate risk for complications without adequate follow up.  This format is felt to be most appropriate for this patient at this time.  The patient did not have access to video technology/had technical difficulties with video requiring transitioning to audio format only (telephone).  All issues noted in this document were discussed and addressed.  No physical exam could be performed with this format.  Please refer to the patient's chart for her  consent to telehealth for [facility].   Date:  05/31/2019   ID:  Kathleen [name], DOB 10-Mar-1956, MRN [id number]  Patient Location: Home Provider Location: Home  PCP:  Mosetta Putt, MD  Cardiologist:  No primary care provider on file. Shayla Heming Electrophysiologist:  None   Evaluation Performed:  Follow-Up Visit  Chief Complaint: Hypercholesterolemia  History of Present Illness:    Kathleen [name] is a 64 y.o. female with severe hypercholesterolemia, almost certainly heterozygous familial hypercholesterolemia, intolerant to multiple statins due to severe myalgia, hypertension controlled with sodium restriction and exercise.  She eats a very healthy diet, exercises regularly and is very lean.  Despite this she has had persistently elevated total cholesterol and in particular LDL cholesterol (194 when last checked).  We started Repatha couple of months ago and she is tolerating it very well.  Her husband administers the shots.  The co-pay is affordable at about $35 a month.  We have not yet rechecked her lipids to see the response.  She has labs scheduled with Dr. Duaine Dredge in a couple of months.  The patient does not have symptoms  concerning for COVID-19 infection (fever, chills, cough, or new shortness of breath).    Past Medical History:  Diagnosis Date  . Clostridium difficile infection   . Hyperlipidemia   . Thyroid disease    Past Surgical History:  Procedure Laterality Date  . ANKLE FRACTURE SURGERY    . CERVICAL CONE BIOPSY       Current Meds  Medication Sig  . ALPRAZolam (XANAX) 0.5 MG tablet Take 0.5 mg by mouth as needed.   Marland Kitchen aspirin EC 81 MG tablet Take 81 mg by mouth daily.  . Cholecalciferol (VITAMIN D3) 5000 UNITS CAPS Take 5,000 Units by mouth daily. Reported on 09/24/2015  . estazolam (PROSOM) 2 MG tablet TK 1 T PO QHS PRN  . Evolocumab (REPATHA SURECLICK) 140 MG/ML SOAJ Inject 140 mg into the skin every 14 (fourteen) days.  Marland Kitchen levothyroxine (SYNTHROID) 150 MCG tablet Take 150 mcg by mouth daily.   . sertraline (ZOLOFT) 100 MG tablet Take 200 mg by mouth daily.   . Zinc 50 MG TABS Take 1 tablet by mouth daily.     Allergies:   Clindamycin/lincomycin   Social History   Tobacco Use  . Smoking status: Never Smoker  . Smokeless tobacco: Never Used  Substance Use Topics  . Alcohol use: No    Alcohol/week: 0.0 standard drinks  . Drug use: No     Family Hx: The patient's family history includes Deep vein thrombosis in her sister; Heart disease in her father.  ROS:   Please see the history of present illness.     All other systems reviewed and are negative.   Prior CV  studies:   The following studies were reviewed today:   Labs/Other Tests and Data Reviewed:    EKG:  No ECG reviewed.  Recent Labs: 02/19/2019: ALT 6; BUN 8; Creatinine, Ser 0.73; Potassium 4.7; Sodium 138   Recent Lipid Panel Lab Results  Component Value Date/Time   CHOL 263 (H) 02/19/2019 10:40 AM   TRIG 86 02/19/2019 10:40 AM   HDL 54 02/19/2019 10:40 AM   CHOLHDL 4.9 (H) 02/19/2019 10:40 AM   CHOLHDL 4 07/20/2016 09:43 AM   LDLCALC 194 (H) 02/19/2019 10:40 AM    Wt Readings from Last 3 Encounters:    05/31/19 154 lb (69.9 kg)  02/19/19 144 lb 9.6 oz (65.6 kg)  07/20/16 148 lb 8 oz (67.4 kg)     Objective:    Vital Signs:  BP 137/77   Pulse (!) 57   Ht 5\' 7"  (1.702 m)   Wt 154 lb (69.9 kg)   BMI 24.12 kg/m    VITAL SIGNS:  reviewed Unable to examine  ASSESSMENT & [name]:    1. Familial heterozygous hypercholesterolemia:tolerating Repatha without side effects, to have repeat lipid profile checked in a couple of months.  Continue healthy lifestyle, regular exercise.   2. HTN: her blood pressure is borderline elevated today, but she reports that her systolic blood pressure is often in the 100-110 range.  No medications are necessary.  Follow-up in 1 year.  COVID-19 Education: The signs and symptoms of COVID-19 were discussed with the patient and how to seek care for testing (follow up with PCP or arrange E-visit).  The importance of social distancing was discussed today.  Time:   Today, I have spent 11 minutes with the patient with telehealth technology discussing the above problems.     Medication Adjustments/Labs and Tests Ordered: Current medicines are reviewed at length with the patient today.  Concerns regarding medicines are outlined above.   Tests Ordered: No orders of the defined types were placed in this encounter.   Medication Changes: No orders of the defined types were placed in this encounter.   Follow Up:  1 yea r  Signed, Sanda Klein, MD  05/31/2019 9:09 AM    [place]

## 2019-08-12 ENCOUNTER — Other Ambulatory Visit: Payer: Self-pay

## 2019-08-12 ENCOUNTER — Ambulatory Visit (HOSPITAL_COMMUNITY)
Admission: RE | Admit: 2019-08-12 | Discharge: 2019-08-12 | Disposition: A | Discharge status: Home / Self Care | Payer: BC Managed Care – PPO | Source: Ambulatory Visit | Attending: Surgery | Admitting: Surgery

## 2019-08-12 ENCOUNTER — Other Ambulatory Visit (HOSPITAL_COMMUNITY): Payer: Self-pay | Admitting: Family Medicine

## 2019-08-12 DIAGNOSIS — I779 Disorder of arteries and arterioles, unspecified: Secondary | ICD-10-CM | POA: Diagnosis present

## 2019-10-18 ENCOUNTER — Other Ambulatory Visit: Payer: Self-pay

## 2019-10-18 ENCOUNTER — Other Ambulatory Visit: Payer: Self-pay | Admitting: Pharmacist

## 2019-10-18 MED ORDER — REPATHA SURECLICK 140 MG/ML ~~LOC~~ SOAJ: 140.0000 mg | SUBCUTANEOUS | 11 refills | Status: DC

## 2019-11-29 ENCOUNTER — Emergency Department (HOSPITAL_BASED_OUTPATIENT_CLINIC_OR_DEPARTMENT_OTHER): Payer: BC Managed Care – PPO

## 2019-11-29 ENCOUNTER — Encounter (HOSPITAL_BASED_OUTPATIENT_CLINIC_OR_DEPARTMENT_OTHER): Payer: Self-pay | Admitting: Emergency Medicine

## 2019-11-29 ENCOUNTER — Inpatient Hospital Stay (HOSPITAL_BASED_OUTPATIENT_CLINIC_OR_DEPARTMENT_OTHER)
Admission: EM | Admit: 2019-11-29 | Discharge: 2019-12-02 | DRG: 885 | Disposition: A | Discharge status: Other Acute Inpatient Hospital | Payer: BC Managed Care – PPO | Attending: Internal Medicine | Admitting: Internal Medicine

## 2019-11-29 DIAGNOSIS — E785 Hyperlipidemia, unspecified: Secondary | ICD-10-CM | POA: Diagnosis present

## 2019-11-29 DIAGNOSIS — Z79890 Hormone replacement therapy (postmenopausal): Secondary | ICD-10-CM | POA: Diagnosis not present

## 2019-11-29 DIAGNOSIS — Z9114 Patient's other noncompliance with medication regimen: Secondary | ICD-10-CM

## 2019-11-29 DIAGNOSIS — E039 Hypothyroidism, unspecified: Secondary | ICD-10-CM | POA: Diagnosis present

## 2019-11-29 DIAGNOSIS — F12159 Cannabis abuse with psychotic disorder, unspecified: Secondary | ICD-10-CM | POA: Diagnosis present

## 2019-11-29 DIAGNOSIS — E7800 Pure hypercholesterolemia, unspecified: Secondary | ICD-10-CM | POA: Diagnosis present

## 2019-11-29 DIAGNOSIS — R4182 Altered mental status, unspecified: Secondary | ICD-10-CM | POA: Diagnosis not present

## 2019-11-29 DIAGNOSIS — Z79899 Other long term (current) drug therapy: Secondary | ICD-10-CM | POA: Diagnosis not present

## 2019-11-29 DIAGNOSIS — Z20822 Contact with and (suspected) exposure to covid-19: Secondary | ICD-10-CM | POA: Diagnosis present

## 2019-11-29 DIAGNOSIS — Z9119 Patient's noncompliance with other medical treatment and regimen: Secondary | ICD-10-CM

## 2019-11-29 DIAGNOSIS — Z8249 Family history of ischemic heart disease and other diseases of the circulatory system: Secondary | ICD-10-CM

## 2019-11-29 DIAGNOSIS — F310 Bipolar disorder, current episode hypomanic: Principal | ICD-10-CM | POA: Diagnosis present

## 2019-11-29 DIAGNOSIS — K219 Gastro-esophageal reflux disease without esophagitis: Secondary | ICD-10-CM | POA: Diagnosis present

## 2019-11-29 DIAGNOSIS — Z7982 Long term (current) use of aspirin: Secondary | ICD-10-CM

## 2019-11-29 DIAGNOSIS — F1290 Cannabis use, unspecified, uncomplicated: Secondary | ICD-10-CM

## 2019-11-29 DIAGNOSIS — E038 Other specified hypothyroidism: Secondary | ICD-10-CM | POA: Diagnosis not present

## 2019-11-29 DIAGNOSIS — I10 Essential (primary) hypertension: Secondary | ICD-10-CM | POA: Diagnosis present

## 2019-11-29 DIAGNOSIS — G9341 Metabolic encephalopathy: Secondary | ICD-10-CM | POA: Diagnosis present

## 2019-11-29 DIAGNOSIS — F12150 Cannabis abuse with psychotic disorder with delusions: Secondary | ICD-10-CM

## 2019-11-29 LAB — PROTIME-INR
INR: 1 (ref 0.8–1.2)
Prothrombin Time: 12.8 seconds (ref 11.4–15.2)

## 2019-11-29 LAB — CBC WITH DIFFERENTIAL/PLATELET
Abs Immature Granulocytes: 0.06 10*3/uL (ref 0.00–0.07)
Basophils Absolute: 0 10*3/uL (ref 0.0–0.1)
Basophils Relative: 0 %
Eosinophils Absolute: 0 10*3/uL (ref 0.0–0.5)
Eosinophils Relative: 0 %
HCT: 45.2 % (ref 36.0–46.0)
Hemoglobin: 15.1 g/dL — ABNORMAL HIGH (ref 12.0–15.0)
Immature Granulocytes: 0 %
Lymphocytes Relative: 6 %
Lymphs Abs: 0.8 10*3/uL (ref 0.7–4.0)
MCH: 29.8 pg (ref 26.0–34.0)
MCHC: 33.4 g/dL (ref 30.0–36.0)
MCV: 89.3 fL (ref 80.0–100.0)
Monocytes Absolute: 0.4 10*3/uL (ref 0.1–1.0)
Monocytes Relative: 3 %
Neutro Abs: 12.6 10*3/uL — ABNORMAL HIGH (ref 1.7–7.7)
Neutrophils Relative %: 91 %
Platelets: 305 10*3/uL (ref 150–400)
RBC: 5.06 MIL/uL (ref 3.87–5.11)
RDW: 13.2 % (ref 11.5–15.5)
WBC: 13.9 10*3/uL — ABNORMAL HIGH (ref 4.0–10.5)
nRBC: 0 % (ref 0.0–0.2)

## 2019-11-29 LAB — RAPID URINE DRUG SCREEN, HOSP PERFORMED
Amphetamines: NOT DETECTED
Barbiturates: NOT DETECTED
Benzodiazepines: POSITIVE — AB
Cocaine: NOT DETECTED
Opiates: NOT DETECTED
Tetrahydrocannabinol: POSITIVE — AB

## 2019-11-29 LAB — URINALYSIS, MICROSCOPIC (REFLEX)

## 2019-11-29 LAB — COMPREHENSIVE METABOLIC PANEL
ALT: 11 U/L (ref 0–44)
AST: 16 U/L (ref 15–41)
Albumin: 5.5 g/dL — ABNORMAL HIGH (ref 3.5–5.0)
Alkaline Phosphatase: 68 U/L (ref 38–126)
Anion gap: 18 — ABNORMAL HIGH (ref 5–15)
BUN: 7 mg/dL — ABNORMAL LOW (ref 8–23)
CO2: 23 mmol/L (ref 22–32)
Calcium: 9.9 mg/dL (ref 8.9–10.3)
Chloride: 97 mmol/L — ABNORMAL LOW (ref 98–111)
Creatinine, Ser: 0.55 mg/dL (ref 0.44–1.00)
GFR calc Af Amer: >60 mL/min (ref >60)
GFR calc non Af Amer: >60 mL/min (ref >60)
Glucose, Bld: 149 mg/dL — ABNORMAL HIGH (ref 70–99)
Potassium: 3.4 mmol/L — ABNORMAL LOW (ref 3.5–5.1)
Sodium: 138 mmol/L (ref 135–145)
Total Bilirubin: 0.7 mg/dL (ref 0.3–1.2)
Total Protein: 8.8 g/dL — ABNORMAL HIGH (ref 6.5–8.1)

## 2019-11-29 LAB — SARS CORONAVIRUS 2 BY RT PCR (HOSPITAL ORDER, PERFORMED IN ~~LOC~~ HOSPITAL LAB): SARS Coronavirus 2: NEGATIVE

## 2019-11-29 LAB — URINALYSIS, ROUTINE W REFLEX MICROSCOPIC
Bilirubin Urine: NEGATIVE
Glucose, UA: NEGATIVE mg/dL
Hgb urine dipstick: NEGATIVE
Ketones, ur: >80 mg/dL — AB
Leukocytes,Ua: NEGATIVE
Nitrite: NEGATIVE
Protein, ur: 100 mg/dL — AB
Specific Gravity, Urine: >1.03 — ABNORMAL HIGH (ref 1.005–1.030)
pH: 6 (ref 5.0–8.0)

## 2019-11-29 LAB — CBG MONITORING, ED: Glucose-Capillary: 137 mg/dL — ABNORMAL HIGH (ref 70–99)

## 2019-11-29 LAB — ACETAMINOPHEN LEVEL: Acetaminophen (Tylenol), Serum: <10 ug/mL — ABNORMAL LOW (ref 10–30)

## 2019-11-29 LAB — LACTIC ACID, PLASMA: Lactic Acid, Venous: 1.5 mmol/L (ref 0.5–1.9)

## 2019-11-29 LAB — TROPONIN I (HIGH SENSITIVITY): Troponin I (High Sensitivity): 12 ng/L (ref <18)

## 2019-11-29 LAB — SALICYLATE LEVEL: Salicylate Lvl: <7 mg/dL — ABNORMAL LOW (ref 7.0–30.0)

## 2019-11-29 LAB — LIPASE, BLOOD: Lipase: 20 U/L (ref 11–51)

## 2019-11-29 LAB — AMMONIA: Ammonia: 18 umol/L (ref 9–35)

## 2019-11-29 LAB — ETHANOL: Alcohol, Ethyl (B): <10 mg/dL (ref <10)

## 2019-11-29 MED ORDER — ONDANSETRON HCL 4 MG/2ML IJ SOLN
4.0000 mg | Freq: Once | INTRAMUSCULAR | Status: AC
  Administered 2019-11-29: 4 mg via INTRAVENOUS
  Filled 2019-11-29: qty 2

## 2019-11-29 MED ORDER — LABETALOL HCL 5 MG/ML IV SOLN
20.0000 mg | Freq: Once | INTRAVENOUS | Status: AC
  Administered 2019-11-29: 20 mg via INTRAVENOUS
  Filled 2019-11-29: qty 4

## 2019-11-29 MED ORDER — LABETALOL HCL 5 MG/ML IV SOLN
10.0000 mg | Freq: Once | INTRAVENOUS | Status: AC
  Administered 2019-11-29: 10 mg via INTRAVENOUS
  Filled 2019-11-29: qty 4

## 2019-11-29 MED ORDER — SODIUM CHLORIDE 0.9 % IV BOLUS
1000.0000 mL | Freq: Once | INTRAVENOUS | Status: AC
  Administered 2019-11-29: 1000 mL via INTRAVENOUS

## 2019-11-29 NOTE — ED Triage Notes (Signed)
PT presents with husband with being paranoid for a couple days and saying irrational things and memory is not what it normally is. Pt yelling answers in triage that are not correct. befast negative, no neuro deficits noted, pt alert and oriented but husband states she is acting  inappropriately .PT yelling yes in triage to every questions I ask. Husband states she was doing very strange things at home for a couple days cutting lights on all night long and pulling wires out of cable boxes , taking baths in the middle of the night.  Husband states she vomits every time today when she puts anything in her stomach. Doctor Tegler called to triage to look at patient.

## 2019-11-29 NOTE — ED Notes (Signed)
[facility] notified (Kathleen [name]) - patient ready for transport 

## 2019-11-29 NOTE — ED Provider Notes (Signed)
[facility] EMERGENCY DEPARTMENT Provider Note   CSN: [id number] Arrival date & time: 11/29/19  1247     History Chief Complaint  Patient presents with  . Paranoid    Kathleen [name] is a 64 y.o. female.  The history is provided by the patient, the spouse and medical records. No language interpreter was used.  Altered Mental Status Presenting symptoms: behavior changes, confusion and memory loss   Presenting symptoms: no disorientation, no lethargy, no partial responsiveness and no unresponsiveness   Severity:  Severe Most recent episode:  More than 2 days ago Episode history:  Continuous Timing:  Constant Progression:  Waxing and waning Chronicity:  New Context: not taking medications as prescribed (she is unsure)   Context: not dementia, not drug use, not head injury, not nursing home resident, not recent change in medication, not recent illness and not recent infection   Associated symptoms: no abdominal pain, no agitation, no bladder incontinence, no decreased appetite, no depression, no difficulty breathing, no fever, no hallucinations, no headaches, no light-headedness, no nausea, no palpitations, no rash, no seizures, no slurred speech, no suicidal behavior, no visual change, no vomiting and no weakness        Past Medical History:  Diagnosis Date  . Clostridium difficile infection   . Hyperlipidemia   . Thyroid disease     Patient Active Problem List   Diagnosis Date Noted  . Physical exam 07/20/2016  . C. difficile enteritis 09/25/2015  . Hypercholesterolemia 02/27/2014  . Left carotid bruit 02/27/2014  . Decreased exercise tolerance 02/27/2014  . Hypothyroidism 08/18/2007  . Depression 08/18/2007  . GERD 08/18/2007    Past Surgical History:  Procedure Laterality Date  . ANKLE FRACTURE SURGERY    . CERVICAL CONE BIOPSY       OB History   No obstetric history on file.     Family History  Problem Relation Age of Onset  . Heart disease  Father   . Deep vein thrombosis Sister     Social History   Tobacco Use  . Smoking status: Never Smoker  . Smokeless tobacco: Never Used  Substance Use Topics  . Alcohol use: No    Alcohol/week: 0.0 standard drinks  . Drug use: No    Home Medications Prior to Admission medications   Medication Sig Start Date End Date Taking? Authorizing Provider  ALPRAZolam Prudy Feeler(XANAX) 0.5 MG tablet Take 0.5 mg by mouth as needed.  02/13/14   [provider]  aspirin EC 81 MG tablet Take 81 mg by mouth daily.    [provider]  Cholecalciferol (VITAMIN D3) 5000 UNITS CAPS Take 5,000 Units by mouth daily. Reported on 09/24/2015    [provider]  estazolam (PROSOM) 2 MG tablet TK 1 T PO QHS PRN 07/12/16   [provider]  Evolocumab (REPATHA SURECLICK) 140 MG/ML SOAJ Inject 140 mg into the skin every 14 (fourteen) days. 10/18/19   Croitoru, Mihai, MD  levothyroxine (SYNTHROID) 150 MCG tablet Take 150 mcg by mouth daily.  01/18/15   [provider]  sertraline (ZOLOFT) 100 MG tablet Take 200 mg by mouth daily.     [provider]  Zinc 50 MG TABS Take 1 tablet by mouth daily.    [provider]    Allergies    Clindamycin/lincomycin  Review of Systems   Review of Systems  Constitutional: Negative for chills, decreased appetite, diaphoresis, fatigue and fever.  HENT: Negative for congestion.   Eyes: Negative for photophobia  and visual disturbance.  Respiratory: Negative for cough, chest tightness, shortness of breath, wheezing and stridor.   Cardiovascular: Negative for chest pain, palpitations and leg swelling.  Gastrointestinal: Negative for abdominal pain, diarrhea, nausea and vomiting.  Genitourinary: Negative for bladder incontinence, difficulty urinating, dysuria and flank pain.  Musculoskeletal: Negative for back pain, neck pain and neck stiffness.  Skin: Negative for rash and wound.  Neurological: Negative for dizziness, seizures,  weakness, light-headedness and headaches.  Psychiatric/Behavioral: Positive for behavioral problems (yelling out occasionally), confusion and memory loss. Negative for agitation and hallucinations.  All other systems reviewed and are negative.   Physical Exam Updated Vital Signs BP (!) 199/99 (BP Location: Right Arm)   Pulse 98   Temp 98.6 F (37 C) (Oral)   Resp 20   Ht 5\' 7"  (1.702 m)   Wt 56.7 kg   SpO2 99%   BMI 19.56 kg/m   Physical Exam Vitals and nursing note reviewed.  Constitutional:      General: She is not in acute distress.    Appearance: She is well-developed. She is not ill-appearing, toxic-appearing or diaphoretic.  HENT:     Head: Normocephalic and atraumatic.     Right Ear: External ear normal.     Left Ear: External ear normal.     Nose: Nose normal. No congestion or rhinorrhea.     Mouth/Throat:     Mouth: Mucous membranes are moist.     Pharynx: No oropharyngeal exudate or posterior oropharyngeal erythema.  Eyes:     General: No visual field deficit.    Extraocular Movements: Extraocular movements intact.     Conjunctiva/sclera: Conjunctivae normal.     Pupils: Pupils are equal, round, and reactive to light.  Cardiovascular:     Rate and Rhythm: Normal rate.     Pulses: Normal pulses.  Pulmonary:     Effort: Pulmonary effort is normal. No respiratory distress.     Breath sounds: No stridor. No wheezing, rhonchi or rales.  Chest:     Chest wall: No tenderness.  Abdominal:     General: There is no distension.     Tenderness: There is no abdominal tenderness. There is no right CVA tenderness, left CVA tenderness or rebound.  Musculoskeletal:        General: No tenderness.     Cervical back: Normal range of motion and neck supple. No rigidity or tenderness.     Right lower leg: No edema.     Left lower leg: No edema.  Skin:    General: Skin is warm.     Capillary Refill: Capillary refill takes less than 2 seconds.     Findings: No erythema or  rash.  Neurological:     General: No focal deficit present.     Mental Status: She is alert and oriented to person, place, and time.     GCS: GCS eye subscore is 4. GCS verbal subscore is 5. GCS motor subscore is 6.     Cranial Nerves: No cranial nerve deficit, dysarthria or facial asymmetry.     Sensory: No sensory deficit.     Motor: No weakness, tremor, abnormal muscle tone or seizure activity.     Coordination: Coordination normal. Finger-Nose-Finger Test normal.     Gait: Gait is intact. Gait normal.  Psychiatric:        Thought Content: Thought content is paranoid.     ED Results / Procedures / Treatments   Labs (all labs ordered are listed, but only  abnormal results are displayed) Labs Reviewed  CBC WITH DIFFERENTIAL/PLATELET - Abnormal; Notable for the following components:      Result Value   WBC 13.9 (*)    Hemoglobin 15.1 (*)    Neutro Abs 12.6 (*)    All other components within normal limits  COMPREHENSIVE METABOLIC PANEL - Abnormal; Notable for the following components:   Potassium 3.4 (*)    Chloride 97 (*)    Glucose, Bld 149 (*)    BUN 7 (*)    Total Protein 8.8 (*)    Albumin 5.5 (*)    Anion gap 18 (*)    All other components within normal limits  ACETAMINOPHEN LEVEL - Abnormal; Notable for the following components:   Acetaminophen (Tylenol), Serum <10 (*)    All other components within normal limits  SALICYLATE LEVEL - Abnormal; Notable for the following components:   Salicylate Lvl <7.0 (*)    All other components within normal limits  CBG MONITORING, ED - Abnormal; Notable for the following components:   Glucose-Capillary 137 (*)    All other components within normal limits  URINE CULTURE  SARS CORONAVIRUS 2 BY RT PCR (HOSPITAL ORDER, PERFORMED IN [facility])  LIPASE, BLOOD  AMMONIA  PROTIME-INR  ETHANOL  LACTIC ACID, PLASMA  LACTIC ACID, PLASMA  URINALYSIS, ROUTINE W REFLEX MICROSCOPIC  RAPID URINE DRUG SCREEN, HOSP PERFORMED    TROPONIN I (HIGH SENSITIVITY)    EKG EKG Interpretation  Date/Time:  Friday November 29 2019 14:43:52 EDT Ventricular Rate:  94 PR Interval:  138 QRS Duration: 80 QT Interval:  356 QTC Calculation: 445 R Axis:   80 Text Interpretation: Normal sinus rhythm Right atrial enlargement Borderline ECG No prior ECG for comparison. No STEMI Confirmed by Theda Belfast ([id number]) on 11/29/2019 3:03:37 PM   Radiology No results found.  Procedures Procedures (including critical care time)  Medications Ordered in ED Medications  labetalol (NORMODYNE) injection 10 mg (has no administration in time range)    ED Course  I have reviewed the triage vital signs and the nursing notes.  Pertinent labs & imaging results that were available during my care of the patient were reviewed by me and considered in my medical decision making (see chart for details).    MDM Rules/Calculators/A&P                          Kathleen [name] is a 64 y.o. female with a past medical history significant for hypothyroidism, depression, GERD, and hypercholesterolemia who presents with altered mental status.  According to the patient's husband, on Monday, 5 days ago, she did a 24-hour intermittent fast.  He reports that since Tuesday, she has been acting very abnormally.  He reports she has been getting up at night and proclaiming that "I have the answer" and then running downstairs unplugging appliances and opening the dishwasher before going back to bed.  She has then been acting paranoid and tried to reformat the home computer and did a factory reset on her phone because she was worried about data.  She then tried to unplug more appliances.  She told people working outside that a gas line was going to blow today.  When asked about this she reports she does not know why she was saying these things.  She agrees that she is not thinking clearly and does not know why.  She is never had this before.  Husband reports she has never had  any mental health problems and has never had confusion like this.  She denies any new medications or stopping medications but she does report with her confusion she may not have been taking medicine like she is supposed to be.  She denies any trauma or injuries.  She denies any physical complaints at this time specifically no headache, neck pain, neck stiffness, vision changes, speech difficulties, chest pain, palpitations, shortness of breath, nausea, vomiting, urinary changes, constipation, diarrhea.  She denies any lightheadedness or syncope.  She otherwise feels well aside from feeling and acting confused.  They report a strong family history of strokes.  Husband does say that she does not usually have elevated blood pressures and her blood pressure in the emergency department is in the 190s on arrival.  On exam, lungs were clear and chest was nontender.  Abdomen was nontender.  Normal finger-nose-finger testing bilaterally.  Normal sensation and strength in extremities.  No rashes.  No ticks seen on physical exam.  Normal extraocular movements.  Clear speech.  Normal object naming.  She is alert and oriented.  Symmetric smile.  No evidence of trauma or bruising seen.  Exam is unremarkable aside from intermittently explaining yes.  Clinically I am concerned about encephalopathy from different etiologies.  She has no fevers or chills, headache, neck pain, neck stiffness, doubt an infectious meningitis or infectious encephalopathy in her CSF.  We did consider a UTI or subtle pneumonia contributing to encephalopathy however hypertensive encephalopathy is also considered.  We discussed possibility of PRESS, stroke or a focal seizure causing the symptoms.  We will get screening labs and monitor her blood pressure.  Her blood pressure does not improve when she gets to her exam room, anticipate giving some blood pressure medication.  If her work-up does not show any significant findings, she may require  admission for encephalopathy of unknown etiology but likely related to elevated blood pressures.  Care transferred to Dr. Criss Alvine while awaiting for her work-up to be completed.   Final Clinical Impression(s) / ED Diagnoses Final diagnoses:  Altered mental status, unspecified altered mental status type    Clinical Impression: 1. Altered mental status, unspecified altered mental status type     Disposition: Care transferred to Dr. Criss Alvine while awaiting for her work-up to be completed  This note was prepared with assistance of Dragon voice recognition software. Occasional wrong-word or sound-a-like substitutions may have occurred due to the inherent limitations of voice recognition software.     Koston Hennes, Canary Brim, MD 11/29/19 [phone]

## 2019-11-29 NOTE — ED Provider Notes (Signed)
Care transferred to me. CT head, CXR, labs have been reviewed. Nothing particularly alarming at this time. She is acting odd over past 3 days. Is pretty hypertensive, will be given IV labetalol. Doubt CNS infection, though I think she needs an MRI to rule out PRES vs CVA.  I discussed with Dr. Natale Milch, who will admit to [facility].   Pricilla Loveless, MD 11/29/19 [phone]

## 2019-11-30 ENCOUNTER — Inpatient Hospital Stay (HOSPITAL_COMMUNITY): Payer: BC Managed Care – PPO

## 2019-11-30 DIAGNOSIS — F1290 Cannabis use, unspecified, uncomplicated: Secondary | ICD-10-CM

## 2019-11-30 DIAGNOSIS — E7800 Pure hypercholesterolemia, unspecified: Secondary | ICD-10-CM

## 2019-11-30 DIAGNOSIS — I10 Essential (primary) hypertension: Secondary | ICD-10-CM

## 2019-11-30 DIAGNOSIS — E038 Other specified hypothyroidism: Secondary | ICD-10-CM

## 2019-11-30 DIAGNOSIS — R4182 Altered mental status, unspecified: Secondary | ICD-10-CM

## 2019-11-30 DIAGNOSIS — G9341 Metabolic encephalopathy: Secondary | ICD-10-CM

## 2019-11-30 DIAGNOSIS — R410 Disorientation, unspecified: Secondary | ICD-10-CM

## 2019-11-30 LAB — CBC
HCT: 43.4 % (ref 36.0–46.0)
Hemoglobin: 14.4 g/dL (ref 12.0–15.0)
MCH: 29.6 pg (ref 26.0–34.0)
MCHC: 33.2 g/dL (ref 30.0–36.0)
MCV: 89.1 fL (ref 80.0–100.0)
Platelets: 274 10*3/uL (ref 150–400)
RBC: 4.87 MIL/uL (ref 3.87–5.11)
RDW: 13.2 % (ref 11.5–15.5)
WBC: 13.1 10*3/uL — ABNORMAL HIGH (ref 4.0–10.5)
nRBC: 0 % (ref 0.0–0.2)

## 2019-11-30 LAB — BASIC METABOLIC PANEL
Anion gap: 15 (ref 5–15)
BUN: 5 mg/dL — ABNORMAL LOW (ref 8–23)
CO2: 23 mmol/L (ref 22–32)
Calcium: 9.7 mg/dL (ref 8.9–10.3)
Chloride: 98 mmol/L (ref 98–111)
Creatinine, Ser: 0.55 mg/dL (ref 0.44–1.00)
GFR calc Af Amer: >60 mL/min (ref >60)
GFR calc non Af Amer: >60 mL/min (ref >60)
Glucose, Bld: 114 mg/dL — ABNORMAL HIGH (ref 70–99)
Potassium: 3.4 mmol/L — ABNORMAL LOW (ref 3.5–5.1)
Sodium: 136 mmol/L (ref 135–145)

## 2019-11-30 LAB — HIV ANTIBODY (ROUTINE TESTING W REFLEX): HIV Screen 4th Generation wRfx: NONREACTIVE

## 2019-11-30 LAB — TSH: TSH: 1.959 u[IU]/mL (ref 0.350–4.500)

## 2019-11-30 MED ORDER — ACETAMINOPHEN 325 MG PO TABS: 650.0000 mg | ORAL_TABLET | Freq: Four times a day (QID) | ORAL | Status: DC | PRN

## 2019-11-30 MED ORDER — ONDANSETRON HCL 4 MG PO TABS: 4.0000 mg | ORAL_TABLET | Freq: Four times a day (QID) | ORAL | Status: DC | PRN

## 2019-11-30 MED ORDER — METOPROLOL TARTRATE 5 MG/5ML IV SOLN
5.0000 mg | INTRAVENOUS | Status: DC | PRN
  Administered 2019-11-30 – 2019-12-01 (×2): 5 mg via INTRAVENOUS
  Filled 2019-11-30 (×2): qty 5

## 2019-11-30 MED ORDER — CLONIDINE HCL 0.1 MG PO TABS
0.2000 mg | ORAL_TABLET | Freq: Once | ORAL | Status: AC
  Administered 2019-11-30: 0.2 mg via ORAL
  Filled 2019-11-30: qty 2

## 2019-11-30 MED ORDER — ONDANSETRON HCL 4 MG/2ML IJ SOLN: 4.0000 mg | Freq: Four times a day (QID) | INTRAMUSCULAR | Status: DC | PRN

## 2019-11-30 MED ORDER — LORAZEPAM 2 MG/ML IJ SOLN
INTRAMUSCULAR | Status: AC
  Administered 2019-11-30: 1 mg via INTRAVENOUS
  Filled 2019-11-30: qty 1

## 2019-11-30 MED ORDER — CLONIDINE HCL 0.1 MG PO TABS: 0.1000 mg | ORAL_TABLET | Freq: Four times a day (QID) | ORAL | Status: DC | PRN

## 2019-11-30 MED ORDER — LORAZEPAM 2 MG/ML IJ SOLN: 1.0000 mg | Freq: Once | INTRAMUSCULAR | Status: AC

## 2019-11-30 MED ORDER — POLYETHYLENE GLYCOL 3350 17 G PO PACK: 17.0000 g | PACK | Freq: Every day | ORAL | Status: DC | PRN

## 2019-11-30 MED ORDER — LEVOTHYROXINE SODIUM 75 MCG PO TABS
150.0000 ug | ORAL_TABLET | Freq: Every day | ORAL | Status: DC
  Administered 2019-11-30 – 2019-12-02 (×3): 150 ug via ORAL
  Filled 2019-11-30 (×3): qty 2

## 2019-11-30 MED ORDER — LORAZEPAM 2 MG/ML IJ SOLN: 2.0000 mg | Freq: Once | INTRAMUSCULAR | Status: DC

## 2019-11-30 MED ORDER — METOPROLOL TARTRATE 50 MG PO TABS
50.0000 mg | ORAL_TABLET | Freq: Two times a day (BID) | ORAL | Status: DC
  Administered 2019-11-30 (×3): 50 mg via ORAL
  Filled 2019-11-30 (×3): qty 1

## 2019-11-30 MED ORDER — LORAZEPAM 2 MG/ML IJ SOLN: 1.0000 mg | Freq: Once | INTRAMUSCULAR | Status: DC | PRN

## 2019-11-30 MED ORDER — ACETAMINOPHEN 650 MG RE SUPP: 650.0000 mg | Freq: Four times a day (QID) | RECTAL | Status: DC | PRN

## 2019-11-30 MED ORDER — EVOLOCUMAB 140 MG/ML ~~LOC~~ SOAJ: 140.0000 mg | SUBCUTANEOUS | Status: DC

## 2019-11-30 MED ORDER — ENOXAPARIN SODIUM 40 MG/0.4ML ~~LOC~~ SOLN
40.0000 mg | Freq: Every day | SUBCUTANEOUS | Status: DC
  Administered 2019-12-02: 40 mg via SUBCUTANEOUS
  Filled 2019-11-30 (×3): qty 0.4

## 2019-11-30 MED ORDER — QUETIAPINE FUMARATE 50 MG PO TABS
50.0000 mg | ORAL_TABLET | Freq: Every day | ORAL | Status: DC
  Administered 2019-11-30: 50 mg via ORAL
  Filled 2019-11-30: qty 1

## 2019-11-30 MED ORDER — SERTRALINE HCL 100 MG PO TABS
200.0000 mg | ORAL_TABLET | Freq: Every day | ORAL | Status: DC
  Administered 2019-11-30: 200 mg via ORAL
  Filled 2019-11-30 (×2): qty 2

## 2019-11-30 MED ORDER — ADULT MULTIVITAMIN W/MINERALS CH
1.0000 | ORAL_TABLET | Freq: Every day | ORAL | Status: DC
  Administered 2019-11-30 – 2019-12-02 (×2): 1 via ORAL
  Filled 2019-11-30 (×3): qty 1

## 2019-11-30 MED ORDER — THIAMINE HCL 100 MG/ML IJ SOLN
Freq: Every day | INTRAVENOUS | Status: AC
  Filled 2019-11-30 (×2): qty 1000

## 2019-11-30 NOTE — H&P (Signed)
PCP:   Mosetta Putt, MD   Chief Complaint:  Altered mental status  HPI: This a 64 year old female who was brought into [facility] for with reports of paranoia and confusion.  History provided by husband to [name] includes symptoms starting approximately 5 days ago where patient has been acting abnormally.  She has been acting paranoid, doing a factory reset on the computer and phone because she was concerned about her the data.  Telling people that the gas line outside was about to blow.  Apparently she has been running around plugging out appliances and opening dishwasher before going to bed and stating she has a answer.  Her husband finally brought her into [facility].   The patient has no history of hypertension but her blood pressure 199/99 at its highest.  She received several 10 mg doses of IV labetalol, her sister blood pressure remains above 170.  Additionally her UDS was positive for marijuana and Xanax.  She does have Xanax on her med list.  To me the patient stated she does daily marijuana and has increased her usage recently.  She also adds that she received a package in the mail from [place] which was open.  She states she is knew not to but she used it anyway.   When patient initially arrived she had word salad and pressured speech which is mostly nonsensical.  This is improved somewhat.  The initial interview she appeared coherent but this devolved as interview progressed.  Unsuccessful attempts made to contact patient's husband.  Review of Systems:  Unable to reliably obtain.   Past Medical History: Past Medical History:  Diagnosis Date  . Clostridium difficile infection   . Hyperlipidemia   . Thyroid disease    Past Surgical History:  Procedure Laterality Date  . ANKLE FRACTURE SURGERY    . CERVICAL CONE BIOPSY      Medications: Prior to Admission medications   Medication Sig Start Date End Date Taking? Authorizing Provider  ALPRAZolam Prudy Feeler) 0.5 MG  tablet Take 0.5 mg by mouth as needed for anxiety.  02/13/14  Yes [provider]  aspirin EC 81 MG tablet Take 81 mg by mouth daily.   Yes [provider]  Cholecalciferol (VITAMIN D3) 5000 UNITS CAPS Take 5,000 Units by mouth daily. Reported on 09/24/2015   Yes [provider]  estazolam (PROSOM) 2 MG tablet Take 2 mg by mouth at bedtime as needed (sleep).  07/12/16  Yes [provider]  Evolocumab (REPATHA SURECLICK) 140 MG/ML SOAJ Inject 140 mg into the skin every 14 (fourteen) days. 10/18/19  Yes Croitoru, Mihai, MD  ezetimibe (ZETIA) 10 MG tablet Take 10 mg by mouth daily. 10/23/19  Yes [provider]  levothyroxine (SYNTHROID) 150 MCG tablet Take 150 mcg by mouth daily.  01/18/15  Yes [provider]  sertraline (ZOLOFT) 100 MG tablet Take 200 mg by mouth daily.    Yes [provider]  Zinc 50 MG TABS Take 1 tablet by mouth daily.   Yes [provider]    Allergies:   Allergies  Allergen Reactions  . Clindamycin/Lincomycin Swelling    Gi problems and made throat close up after 7 days    Social History:  reports that she has never smoked. She has never used smokeless tobacco. She reports that she does not drink alcohol and does not use drugs.  Family History: Family History  Problem Relation Age of Onset  . Heart disease Father   .  Deep vein thrombosis Sister     Physical Exam: Vitals:   11/29/19 2100 11/29/19 2141 11/29/19 2213 11/29/19 2301  BP: (!) 177/98 (!) 171/82 (!) 190/101 (!) 190/97  Pulse:  74 82 72  Resp: 15 16 17 17   Temp:  98.2 F (36.8 C)  97.8 F (36.6 C)  TempSrc:  Oral  Oral  SpO2:  100% 99%   Weight:      Height:        General:  Alert and confused patient, well developed and nourished, no acute distress Eyes: PERRLA, pink conjunctiva, no scleral icterus ENT: Moist oral mucosa, neck supple, no thyromegaly Lungs: clear to ascultation, no wheeze, no crackles, no use of accessory  muscles Cardiovascular: regular rate and rhythm, no regurgitation, no gallops, no murmurs. No carotid bruits, no JVD Abdomen: soft, positive BS, non-tender, non-distended, no organomegaly, not an acute abdomen GU: not examined Neuro: CN II - XII grossly intact, sensation intact Musculoskeletal: strength 5/5 all extremities, no clubbing, cyanosis or edema Skin: no rash, no subcutaneous crepitation, no decubitus Psych: Confused, pressured speech   Labs on Admission:  Recent Labs    11/29/19 1427  NA 138  K 3.4*  CL 97*  CO2 23  GLUCOSE 149*  BUN 7*  CREATININE 0.55  CALCIUM 9.9   Recent Labs    11/29/19 1427  AST 16  ALT 11  ALKPHOS 68  BILITOT 0.7  PROT 8.8*  ALBUMIN 5.5*   Recent Labs    11/29/19 1427  LIPASE 20   Recent Labs    11/29/19 1427  WBC 13.9*  NEUTROABS 12.6*  HGB 15.1*  HCT 45.2  MCV 89.3  PLT 305   No results for input(s): CKTOTAL, CKMB, CKMBINDEX, TROPONINI in the last 72 hours. Invalid input(s): POCBNP No results for input(s): DDIMER in the last 72 hours. No results for input(s): HGBA1C in the last 72 hours. No results for input(s): CHOL, HDL, LDLCALC, TRIG, CHOLHDL, LDLDIRECT in the last 72 hours. No results for input(s): TSH, T4TOTAL, T3FREE, THYROIDAB in the last 72 hours.  Invalid input(s): FREET3 No results for input(s): VITAMINB12, FOLATE, FERRITIN, TIBC, IRON, RETICCTPCT in the last 72 hours.  Micro Results: Recent Results (from the past 240 hour(s))  SARS Coronavirus 2 by RT PCR (hospital order, performed in [facility] hospital lab) Nasopharyngeal Nasopharyngeal Swab     Status: None   Collection Time: 11/29/19  3:05 PM   Specimen: Nasopharyngeal Swab  Result Value Ref Range Status   SARS Coronavirus 2 NEGATIVE NEGATIVE Final    Comment: (NOTE) SARS-CoV-2 target nucleic acids are NOT DETECTED.  The SARS-CoV-2 RNA is generally detectable in upper and lower respiratory specimens during the acute phase of infection. The  lowest concentration of SARS-CoV-2 viral copies this assay can detect is 250 copies / mL. A negative result does not preclude SARS-CoV-2 infection and should not be used as the sole basis for treatment or other patient management decisions.  A negative result may occur with improper specimen collection / handling, submission of specimen other than nasopharyngeal swab, presence of viral mutation(s) within the areas targeted by this assay, and inadequate number of viral copies (<250 copies / mL). A negative result must be combined with clinical observations, patient history, and epidemiological information.  Fact Sheet for Patients:   12/01/19  Fact Sheet for Healthcare Providers: BoilerBrush.com.cy  This test is not yet approved or  cleared by the https://pope.com/ FDA and has been authorized for detection and/or diagnosis of  SARS-CoV-2 by FDA under an Emergency Use Authorization (EUA).  This EUA will remain in effect (meaning this test can be used) for the duration of the COVID-19 declaration under Section 564(b)(1) of the Act, 21 U.S.C. section 360bbb-3(b)(1), unless the authorization is terminated or revoked sooner.  Performed at [facility], [address] [place], [place] [address]      Radiological Exams on Admission: DG Chest 2 View  Result Date: 11/29/2019 CLINICAL DATA:  Altered mental status for 2 days EXAM: CHEST - 2 VIEW COMPARISON:  None FINDINGS: Trachea is midline. Cardiomediastinal contours and hilar structures are normal. Lungs are clear.  No sign of pleural effusion. Visualized skeletal structures with limited assessment without acute process. IMPRESSION: No acute cardiopulmonary disease. Electronically Signed   By: Donzetta Kohut M.D.   On: 11/29/2019 16:12   CT Head Wo Contrast  Result Date: 11/29/2019 CLINICAL DATA:  Mental status change, elevated blood pressure EXAM: CT HEAD WITHOUT CONTRAST  TECHNIQUE: Contiguous axial images were obtained from the base of the skull through the vertex without intravenous contrast. COMPARISON:  None FINDINGS: Brain: No evidence of acute infarction, hemorrhage, hydrocephalus, extra-axial collection or mass lesion/mass effect. Signs of atrophy and chronic microvascular ischemic change. Vascular: No hyperdense vessel or unexpected calcification. Skull: Normal. Negative for fracture or focal lesion. Sinuses/Orbits: Visualized paranasal sinuses and orbits are normal. Other: None IMPRESSION: 1. No acute intracranial pathology. 2. Signs of atrophy and chronic microvascular ischemic change. Electronically Signed   By: Donzetta Kohut M.D.   On: 11/29/2019 16:11    Assessment/Plan Present on Admission: . Acute metabolic encephalopathy -Bring for overnight observation med telemetry -Cause may be multifactorial related elevated blood pressures plus substance use.  Will begin blood pressure control and monitor  -Patient also uses Xanax and estazolam.  There is caution with use of Estazolam with another benzo.  Question withdrawal symptoms.  No reports of seizures -A.m. team to consult psych/behavioral health as needed  Uncontrolled hypertension -We will start oral blood pressure medications and continue as needed medications -There was discussion regarding press syndrome, will obtain MRI of head in the a.m.  Polysubstance use -Aware  . Hypothyroidism -We will order TSH before resuming Synthroid.  Depression -Resume Zoloft  . GERD -  . Hypercholesterolemia Resume Zetia  Rayann Jolley 11/30/2019, 12:03 AM

## 2019-11-30 NOTE — Plan of Care (Signed)
  Problem: Education: Goal: Knowledge of General Education information will improve Description: Including pain rating scale, medication(s)/side effects and non-pharmacologic comfort measures Outcome: Progressing   Problem: Activity: Goal: Risk for activity intolerance will decrease Outcome: Progressing   Problem: Nutrition: Goal: Adequate nutrition will be maintained Outcome: Progressing   
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
## 2019-11-30 NOTE — Progress Notes (Signed)
   11/30/19 0200  Assess: MEWS Score  ECG Heart Rate (!) 101  Resp 23  Assess: MEWS Score  MEWS Temp 0  MEWS Systolic 0  MEWS Pulse 1  MEWS RR 1  MEWS LOC 0  MEWS Score 2  MEWS Score Color Yellow  Assess: if the MEWS score is Yellow or Red  Were vital signs taken at a resting state? No (Pt restless & agitated, moving around/attempting to get OOB)  Early Detection of Sepsis Score *See Row Information* Low  MEWS guidelines implemented *See Row Information* No, vital signs rechecked

## 2019-12-01 DIAGNOSIS — F12150 Cannabis abuse with psychotic disorder with delusions: Secondary | ICD-10-CM

## 2019-12-01 DIAGNOSIS — F310 Bipolar disorder, current episode hypomanic: Secondary | ICD-10-CM

## 2019-12-01 LAB — CBC
HCT: 41.5 % (ref 36.0–46.0)
Hemoglobin: 14 g/dL (ref 12.0–15.0)
MCH: 30.2 pg (ref 26.0–34.0)
MCHC: 33.7 g/dL (ref 30.0–36.0)
MCV: 89.6 fL (ref 80.0–100.0)
Platelets: 268 10*3/uL (ref 150–400)
RBC: 4.63 MIL/uL (ref 3.87–5.11)
RDW: 13.2 % (ref 11.5–15.5)
WBC: 12.4 10*3/uL — ABNORMAL HIGH (ref 4.0–10.5)
nRBC: 0 % (ref 0.0–0.2)

## 2019-12-01 LAB — COMPREHENSIVE METABOLIC PANEL
ALT: 10 U/L (ref 0–44)
AST: 16 U/L (ref 15–41)
Albumin: 4.3 g/dL (ref 3.5–5.0)
Alkaline Phosphatase: 56 U/L (ref 38–126)
Anion gap: 11 (ref 5–15)
BUN: 9 mg/dL (ref 8–23)
CO2: 23 mmol/L (ref 22–32)
Calcium: 9.3 mg/dL (ref 8.9–10.3)
Chloride: 101 mmol/L (ref 98–111)
Creatinine, Ser: 0.58 mg/dL (ref 0.44–1.00)
GFR calc Af Amer: >60 mL/min (ref >60)
GFR calc non Af Amer: >60 mL/min (ref >60)
Glucose, Bld: 105 mg/dL — ABNORMAL HIGH (ref 70–99)
Potassium: 3.5 mmol/L (ref 3.5–5.1)
Sodium: 135 mmol/L (ref 135–145)
Total Bilirubin: 0.7 mg/dL (ref 0.3–1.2)
Total Protein: 7 g/dL (ref 6.5–8.1)

## 2019-12-01 LAB — URINE CULTURE

## 2019-12-01 MED ORDER — LORAZEPAM 2 MG/ML IJ SOLN
1.0000 mg | Freq: Two times a day (BID) | INTRAMUSCULAR | Status: DC
  Administered 2019-12-01: 1 mg via INTRAMUSCULAR
  Filled 2019-12-01: qty 1

## 2019-12-01 MED ORDER — SERTRALINE HCL 100 MG PO TABS
100.0000 mg | ORAL_TABLET | Freq: Every day | ORAL | Status: DC
  Administered 2019-12-01 – 2019-12-02 (×2): 100 mg via ORAL
  Filled 2019-12-01 (×2): qty 1

## 2019-12-01 MED ORDER — HALOPERIDOL 5 MG PO TABS: 5.0000 mg | ORAL_TABLET | Freq: Three times a day (TID) | ORAL | Status: DC | PRN

## 2019-12-01 MED ORDER — DIPHENHYDRAMINE HCL 50 MG/ML IJ SOLN
25.0000 mg | Freq: Once | INTRAMUSCULAR | Status: AC
  Administered 2019-12-01: 25 mg via INTRAMUSCULAR
  Filled 2019-12-01 (×2): qty 1

## 2019-12-01 MED ORDER — CARVEDILOL 6.25 MG PO TABS
6.2500 mg | ORAL_TABLET | Freq: Two times a day (BID) | ORAL | Status: DC
  Administered 2019-12-02: 6.25 mg via ORAL
  Filled 2019-12-01 (×3): qty 1

## 2019-12-01 MED ORDER — QUETIAPINE FUMARATE 100 MG PO TABS
100.0000 mg | ORAL_TABLET | Freq: Two times a day (BID) | ORAL | Status: DC
  Administered 2019-12-01 – 2019-12-02 (×2): 100 mg via ORAL
  Filled 2019-12-01 (×3): qty 1

## 2019-12-01 MED ORDER — HALOPERIDOL LACTATE 5 MG/ML IJ SOLN
5.0000 mg | Freq: Three times a day (TID) | INTRAMUSCULAR | Status: DC | PRN
  Administered 2019-12-01: 5 mg via INTRAMUSCULAR
  Filled 2019-12-01: qty 1

## 2019-12-01 MED ORDER — HYDRALAZINE HCL 20 MG/ML IJ SOLN
10.0000 mg | Freq: Once | INTRAMUSCULAR | Status: DC
  Filled 2019-12-01: qty 1

## 2019-12-01 MED ORDER — ZIPRASIDONE MESYLATE 20 MG IM SOLR
20.0000 mg | Freq: Once | INTRAMUSCULAR | Status: AC
  Administered 2019-12-01: 20 mg via INTRAMUSCULAR
  Filled 2019-12-01: qty 20

## 2019-12-01 MED ORDER — LISINOPRIL 5 MG PO TABS
5.0000 mg | ORAL_TABLET | Freq: Every day | ORAL | Status: DC
  Administered 2019-12-02: 5 mg via ORAL
  Filled 2019-12-01 (×2): qty 1

## 2019-12-01 MED ORDER — LORAZEPAM 1 MG PO TABS
1.0000 mg | ORAL_TABLET | Freq: Two times a day (BID) | ORAL | Status: DC
  Administered 2019-12-02: 1 mg via ORAL
  Filled 2019-12-01 (×2): qty 1

## 2019-12-01 MED ORDER — STERILE WATER FOR INJECTION IJ SOLN
INTRAMUSCULAR | Status: AC
  Administered 2019-12-01: 10 mL
  Filled 2019-12-01: qty 10

## 2019-12-01 NOTE — Progress Notes (Signed)
Patient very agitated, yelling and stated she does not want no IV medication/Fluid, she also said she does not want IV in place. Doctor on call notifed, see new order

## 2019-12-01 NOTE — Consult Note (Signed)
[facility] Face-to-Face Psychiatry Consult   Reason for Consult: ''acute psychotic break/manic behavior.'' Referring Physician:  Carma Leaven, MD Patient Identification: Kathleen [name] MRN:  [id number] Principal Diagnosis: Bipolar I disorder, current or most recent episode hypomanic (HCC) Diagnosis:  Principal Problem:   Bipolar I disorder, current or most recent episode hypomanic (HCC) Active Problems:   Hypothyroidism   GERD   Hypercholesterolemia   Acute metabolic encephalopathy   Altered mental status   Marijuana use, continuous   HTN (hypertension), malignant   Cannabis abuse with psychotic disorder, with delusions (HCC)   Total Time spent with patient: 1 hour  Subjective:   Kathleen [name] is a 64 y.o. female patient admitted with paranoia and confusion  HPI:  Patient is 64 year old female with history of Depression, hyperlipidemia, High blood pressure and Cannabis abuse. She is a poor historian and majority of the history is obtained from her husband who is at her bedside. He reports that patient was  doing fine until few days ago when she started acting erratic, bizarre, paranoid, unable to sleep and disorganized. As a result patient was taking [facility] for evaluation from where she was sent to [facility] hospital for full evaluation. Patient remains disorganized, erratic, labile and extremely delusional.She reports that she had to do a factoryreset on the computer and phonebecause she was convinced that her data is being tracked. According to her husband, patient was telling people that the gas line outside their home was going to blow up. Husband reports that she was running around plugging out appliances and opening dishwasher before going to bed and insisted on taking a cold shower around 1 am which was unusual for her. Husband and patient denies any recent stressor in patient's life. But patient reports daily use of about 1 roll of Cannabis which she increased lately.  She has also been engaging in intermittent fasting-eating for 8 hrs and fasting for 16 hours. Patient continues to have pressured speech, mood lability, fixed delusions and disorganized thought process. She denies using other illicit substances and unable to contract for safety.  Past Psychiatric History: as above  Risk to Self:  unknown-patient refused to disclose Risk to Others:  denies Prior Inpatient Therapy:  none in the past Prior Outpatient Therapy:  [facility]  Past Medical History:  Past Medical History:  Diagnosis Date  . Clostridium difficile infection   . Hyperlipidemia   . Thyroid disease     Past Surgical History:  Procedure Laterality Date  . ANKLE FRACTURE SURGERY    . CERVICAL CONE BIOPSY     Family History:  Family History  Problem Relation Age of Onset  . Heart disease Father   . Deep vein thrombosis Sister    Family Psychiatric  History:  Social History:  Social History   Substance and Sexual Activity  Alcohol Use No  . Alcohol/week: 0.0 standard drinks     Social History   Substance and Sexual Activity  Drug Use No    Social History   Socioeconomic History  . Marital status: Married    Spouse name: Not on file  . Number of children: Not on file  . Years of education: Not on file  . Highest education level: Not on file  Occupational History  . Not on file  Tobacco Use  . Smoking status: Never Smoker  . Smokeless tobacco: Never Used  Substance and Sexual Activity  . Alcohol use: No    Alcohol/week: 0.0 standard drinks  .  Drug use: No  . Sexual activity: Not on file  Other Topics Concern  . Not on file  Social History Narrative  . Not on file   Social Determinants of Health   Financial Resource Strain:   . Difficulty of Paying Living Expenses:   Food Insecurity:   . Worried About Programme researcher, broadcasting/film/video in the Last Year:   . Barista in the Last Year:   Transportation Needs:   . Freight forwarder (Medical):    Marland Kitchen Lack of Transportation (Non-Medical):   Physical Activity:   . Days of Exercise per Week:   . Minutes of Exercise per Session:   Stress:   . Feeling of Stress :   Social Connections:   . Frequency of Communication with Friends and Family:   . Frequency of Social Gatherings with Friends and Family:   . Attends Religious Services:   . Active Member of Clubs or Organizations:   . Attends Banker Meetings:   Marland Kitchen Marital Status:    Additional Social History:    Allergies:   Allergies  Allergen Reactions  . Clindamycin/Lincomycin Swelling    Gi problems and made throat close up after 7 days    Labs:  Results for orders placed or performed during the hospital encounter of 11/29/19 (from the past 48 hour(s))  CBG monitoring, ED     Status: Abnormal   Collection Time: 11/29/19  2:03 PM  Result Value Ref Range   Glucose-Capillary 137 (H) 70 - 99 mg/dL    Comment: Glucose reference range applies only to samples taken after fasting for at least 8 hours.   Comment 1 Notify RN   CBC with Differential     Status: Abnormal   Collection Time: 11/29/19  2:27 PM  Result Value Ref Range   WBC 13.9 (H) 4.0 - 10.5 K/uL   RBC 5.06 3.87 - 5.11 MIL/uL   Hemoglobin 15.1 (H) 12.0 - 15.0 g/dL   HCT 84.1 36 - 46 %   MCV 89.3 80.0 - 100.0 fL   MCH 29.8 26.0 - 34.0 pg   MCHC 33.4 30.0 - 36.0 g/dL   RDW 32.4 40.1 - 02.7 %   Platelets 305 150 - 400 K/uL   nRBC 0.0 0.0 - 0.2 %   Neutrophils Relative % 91 %   Neutro Abs 12.6 (H) 1.7 - 7.7 K/uL   Lymphocytes Relative 6 %   Lymphs Abs 0.8 0.7 - 4.0 K/uL   Monocytes Relative 3 %   Monocytes Absolute 0.4 0 - 1 K/uL   Eosinophils Relative 0 %   Eosinophils Absolute 0.0 0 - 0 K/uL   Basophils Relative 0 %   Basophils Absolute 0.0 0 - 0 K/uL   Immature Granulocytes 0 %   Abs Immature Granulocytes 0.06 0.00 - 0.07 K/uL    Comment: Performed at [facility], [address] [place], [place] [address]  Comprehensive metabolic  panel     Status: Abnormal   Collection Time: 11/29/19  2:27 PM  Result Value Ref Range   Sodium 138 135 - 145 mmol/L   Potassium 3.4 (L) 3.5 - 5.1 mmol/L   Chloride 97 (L) 98 - 111 mmol/L   CO2 23 22 - 32 mmol/L   Glucose, Bld 149 (H) 70 - 99 mg/dL    Comment: Glucose reference range applies only to samples taken after fasting for at least 8 hours.   BUN 7 (L) 8 -  23 mg/dL   Creatinine, Ser 1.610.55 0.44 - 1.00 mg/dL   Calcium 9.9 8.9 - 09.610.3 mg/dL   Total Protein 8.8 (H) 6.5 - 8.1 g/dL   Albumin 5.5 (H) 3.5 - 5.0 g/dL   AST 16 15 - 41 U/L   ALT 11 0 - 44 U/L   Alkaline Phosphatase 68 38 - 126 U/L   Total Bilirubin 0.7 0.3 - 1.2 mg/dL   GFR calc non Af Amer >60 >60 mL/min   GFR calc Af Amer >60 >60 mL/min   Anion gap 18 (H) 5 - 15    Comment: Performed at [facility] [place], [address] [place], [place] [address]  Lipase, blood     Status: None   Collection Time: 11/29/19  2:27 PM  Result Value Ref Range   Lipase 20 11 - 51 U/L    Comment: Performed at [facility] [place], [address] [place], [place] [address]  Troponin I (High Sensitivity)     Status: None   Collection Time: 11/29/19  2:27 PM  Result Value Ref Range   Troponin I (High Sensitivity) 12 <18 ng/L    Comment: (NOTE) Elevated high sensitivity troponin I (hsTnI) values and significant  changes across serial measurements may suggest ACS but many other  chronic and acute conditions are known to elevate hsTnI results.  Refer to the "Links" section for chest pain algorithms and additional  guidance. Performed at [facility] [place], [address] [place], [place] [address]   Ammonia     Status: None   Collection Time: 11/29/19  2:27 PM  Result Value Ref Range   Ammonia 18 9 - 35 umol/L    Comment: Performed at [facility] [place], [address] [place], [place] [address]  Protime-INR     Status: None   Collection Time: 11/29/19  2:27 PM  Result Value Ref Range   Prothrombin Time 12.8  11.4 - 15.2 seconds   INR 1.0 0.8 - 1.2    Comment: (NOTE) INR goal varies based on device and disease states. Performed at [facility] [place], [address] [place], [place] [address]   Acetaminophen level     Status: Abnormal   Collection Time: 11/29/19  2:27 PM  Result Value Ref Range   Acetaminophen (Tylenol), Serum <10 (L) 10 - 30 ug/mL    Comment: (NOTE) Therapeutic concentrations vary significantly. A range of 10-30 ug/mL  may be an effective concentration for many patients. However, some  are best treated at concentrations outside of this range. Acetaminophen concentrations >150 ug/mL at 4 hours after ingestion  and >50 ug/mL at 12 hours after ingestion are often associated with  toxic reactions.  Performed at [facility] [place], [address] [place], [place] [address]   Salicylate level     Status: Abnormal   Collection Time: 11/29/19  2:27 PM  Result Value Ref Range   Salicylate Lvl <7.0 (L) 7.0 - 30.0 mg/dL    Comment: Performed at [facility] [place], [address] [place], [place] [address]  Ethanol     Status: None   Collection Time: 11/29/19  2:27 PM  Result Value Ref Range   Alcohol, Ethyl (B) <10 <10 mg/dL    Comment: (NOTE) Lowest detectable limit for serum alcohol is 10 mg/dL.  For medical purposes only. Performed at [facility] [place], [address] [place], [place] [address]  Lactic acid, plasma     Status: None   Collection Time: 11/29/19  3:05 PM  Result Value Ref Range   Lactic Acid, Venous 1.5 0.5 - 1.9 mmol/L    Comment: Performed at [facility], [address] [place], [place] [address]  SARS Coronavirus 2 by RT PCR (hospital order, performed in [facility] hospital lab) Nasopharyngeal Nasopharyngeal Swab     Status: None   Collection Time: 11/29/19  3:05 PM   Specimen: Nasopharyngeal Swab  Result Value Ref Range   SARS Coronavirus 2 NEGATIVE NEGATIVE    Comment: (NOTE) SARS-CoV-2 target nucleic  acids are NOT DETECTED.  The SARS-CoV-2 RNA is generally detectable in upper and lower respiratory specimens during the acute phase of infection. The lowest concentration of SARS-CoV-2 viral copies this assay can detect is 250 copies / mL. A negative result does not preclude SARS-CoV-2 infection and should not be used as the sole basis for treatment or other patient management decisions.  A negative result may occur with improper specimen collection / handling, submission of specimen other than nasopharyngeal swab, presence of viral mutation(s) within the areas targeted by this assay, and inadequate number of viral copies (<250 copies / mL). A negative result must be combined with clinical observations, patient history, and epidemiological information.  Fact Sheet for Patients:   BoilerBrush.com.cy  Fact Sheet for Healthcare Providers: https://pope.com/  This test is not yet approved or  cleared by the [place] FDA and has been authorized for detection and/or diagnosis of SARS-CoV-2 by FDA under an Emergency Use Authorization (EUA).  This EUA will remain in effect (meaning this test can be used) for the duration of the COVID-19 declaration under Section 564(b)(1) of the Act, 21 U.S.C. section 360bbb-3(b)(1), unless the authorization is terminated or revoked sooner.  Performed at [facility], [address] [place], [place] [address]   Urinalysis, Routine w reflex microscopic     Status: Abnormal   Collection Time: 11/29/19  5:45 PM  Result Value Ref Range   Color, Urine YELLOW YELLOW   APPearance CLEAR CLEAR   Specific Gravity, Urine >1.030 (H) 1.005 - 1.030   pH 6.0 5.0 - 8.0   Glucose, UA NEGATIVE NEGATIVE mg/dL   Hgb urine dipstick NEGATIVE NEGATIVE   Bilirubin Urine NEGATIVE NEGATIVE   Ketones, ur >80 (A) NEGATIVE mg/dL   Protein, ur 425 (A) NEGATIVE mg/dL   Nitrite NEGATIVE NEGATIVE   Leukocytes,Ua  NEGATIVE NEGATIVE    Comment: Performed at [facility], [address] [place], [place] [address]  Urine culture     Status: Abnormal   Collection Time: 11/29/19  5:45 PM   Specimen: Urine, Random  Result Value Ref Range   Specimen Description      URINE, RANDOM Performed at [facility], [address] [place], [place] [address]    Special Requests      NONE Performed at [facility], [address] [place], [place] [address]    Culture MULTIPLE SPECIES PRESENT, SUGGEST RECOLLECTION (A)    Report Status 12/01/2019 FINAL   Rapid urine drug screen (hospital performed)     Status: Abnormal   Collection Time: 11/29/19  5:45 PM  Result Value Ref Range   Opiates NONE DETECTED NONE DETECTED   Cocaine NONE DETECTED NONE DETECTED   Benzodiazepines POSITIVE (A) NONE DETECTED   Amphetamines NONE DETECTED NONE DETECTED   Tetrahydrocannabinol POSITIVE (A) NONE  DETECTED   Barbiturates NONE DETECTED NONE DETECTED    Comment: (NOTE) DRUG SCREEN FOR MEDICAL PURPOSES ONLY.  IF CONFIRMATION IS NEEDED FOR ANY PURPOSE, NOTIFY LAB WITHIN 5 DAYS.  LOWEST DETECTABLE LIMITS FOR URINE DRUG SCREEN Drug Class                     Cutoff (ng/mL) Amphetamine and metabolites    1000 Barbiturate and metabolites    200 Benzodiazepine                 200 Tricyclics and metabolites     300 Opiates and metabolites        300 Cocaine and metabolites        300 THC                            50 Performed at [facility], [address] [place], [place] [address]   Urinalysis, Microscopic (reflex)     Status: Abnormal   Collection Time: 11/29/19  5:45 PM  Result Value Ref Range   RBC / HPF 6-10 0 - 5 RBC/hpf   WBC, UA 0-5 0 - 5 WBC/hpf   Bacteria, UA MANY (A) NONE SEEN   Squamous Epithelial / LPF 0-5 0 - 5   Mucus PRESENT    Hyaline Casts, UA PRESENT    Granular Casts, UA PRESENT    WBC Casts, UA PRESENT     Comment: Performed at [facility],  [address] [place], [place] [address]  TSH     Status: None   Collection Time: 11/30/19  1:32 AM  Result Value Ref Range   TSH 1.959 0.350 - 4.500 uIU/mL    Comment: Performed by a 3rd Generation assay with a functional sensitivity of <=0.01 uIU/mL. Performed at [facility], [address] [place], [place] [address]   HIV Antibody (routine testing w rflx)     Status: None   Collection Time: 11/30/19  1:32 AM  Result Value Ref Range   HIV Screen 4th Generation wRfx Non Reactive Non Reactive    Comment: Performed at [facility], [address], [place], [place] [address]  Basic metabolic panel     Status: Abnormal   Collection Time: 11/30/19  1:32 AM  Result Value Ref Range   Sodium 136 135 - 145 mmol/L   Potassium 3.4 (L) 3.5 - 5.1 mmol/L   Chloride 98 98 - 111 mmol/L   CO2 23 22 - 32 mmol/L   Glucose, Bld 114 (H) 70 - 99 mg/dL    Comment: Glucose reference range applies only to samples taken after fasting for at least 8 hours.   BUN 5 (L) 8 - 23 mg/dL   Creatinine, Ser 2.95 0.44 - 1.00 mg/dL   Calcium 9.7 8.9 - 62.1 mg/dL   GFR calc non Af Amer >60 >60 mL/min   GFR calc Af Amer >60 >60 mL/min   Anion gap 15 5 - 15    Comment: Performed at [facility], [address] [place], [place] [address]  CBC     Status: Abnormal   Collection Time: 11/30/19  1:32 AM  Result Value Ref Range   WBC 13.1 (H) 4.0 - 10.5 K/uL   RBC 4.87 3.87 - 5.11 MIL/uL   Hemoglobin 14.4 12.0 - 15.0 g/dL   HCT 78.4 36 - 46 %  MCV 89.1 80.0 - 100.0 fL   MCH 29.6 26.0 - 34.0 pg   MCHC 33.2 30.0 - 36.0 g/dL   RDW 82.9 56.2 - 13.0 %   Platelets 274 150 - 400 K/uL   nRBC 0.0 0.0 - 0.2 %    Comment: Performed at [facility], [address], [place], [place] [address]  CBC     Status: Abnormal   Collection Time: 12/01/19  2:15 AM  Result Value Ref Range   WBC 12.4 (H) 4.0 - 10.5 K/uL   RBC 4.63 3.87 - 5.11 MIL/uL   Hemoglobin 14.0 12.0 - 15.0 g/dL   HCT 46.9 36 - 46 %    MCV 89.6 80.0 - 100.0 fL   MCH 30.2 26.0 - 34.0 pg   MCHC 33.7 30.0 - 36.0 g/dL   RDW 62.9 52.8 - 41.3 %   Platelets 268 150 - 400 K/uL   nRBC 0.0 0.0 - 0.2 %    Comment: Performed at [facility], [address] [place], [place] [address]  Comprehensive metabolic panel     Status: Abnormal   Collection Time: 12/01/19  2:15 AM  Result Value Ref Range   Sodium 135 135 - 145 mmol/L   Potassium 3.5 3.5 - 5.1 mmol/L   Chloride 101 98 - 111 mmol/L   CO2 23 22 - 32 mmol/L   Glucose, Bld 105 (H) 70 - 99 mg/dL    Comment: Glucose reference range applies only to samples taken after fasting for at least 8 hours.   BUN 9 8 - 23 mg/dL   Creatinine, Ser 0.27 0.44 - 1.00 mg/dL   Calcium 9.3 8.9 - 25.3 mg/dL   Total Protein 7.0 6.5 - 8.1 g/dL   Albumin 4.3 3.5 - 5.0 g/dL   AST 16 15 - 41 U/L   ALT 10 0 - 44 U/L   Alkaline Phosphatase 56 38 - 126 U/L   Total Bilirubin 0.7 0.3 - 1.2 mg/dL   GFR calc non Af Amer >60 >60 mL/min   GFR calc Af Amer >60 >60 mL/min   Anion gap 11 5 - 15    Comment: Performed at [facility], [address] [place], [place] [address]    Current Facility-Administered Medications  Medication Dose Route Frequency Provider Last Rate Last Admin  . acetaminophen (TYLENOL) tablet 650 mg  650 mg Oral Q6H PRN Gery Pray, MD       Or  . acetaminophen (TYLENOL) suppository 650 mg  650 mg Rectal Q6H PRN Crosley, Debby, MD      . carvedilol (COREG) tablet 6.25 mg  6.25 mg Oral BID WC Azucena Fallen, MD      . enoxaparin (LOVENOX) injection 40 mg  40 mg Subcutaneous Daily Crosley, Debby, MD      . hydrALAZINE (APRESOLINE) injection 10 mg  10 mg Intravenous Once Azucena Fallen, MD      . levothyroxine (SYNTHROID) tablet 150 mcg  150 mcg Oral Q0600 Azucena Fallen, MD   150 mcg at 12/01/19 0438  . lisinopril (ZESTRIL) tablet 5 mg  5 mg Oral Daily Azucena Fallen, MD      . LORazepam (ATIVAN) tablet 1 mg  1 mg Oral BID Thedore Mins, MD        Or  . LORazepam (ATIVAN) injection 1 mg  1 mg Intramuscular BID Jaeson Molstad, MD      . metoprolol tartrate (LOPRESSOR) injection 5 mg  5 mg Intravenous Q4H PRN Gery Pray, MD   5 mg at 12/01/19 0438  . multivitamin with minerals tablet 1 tablet  1 tablet Oral Daily Azucena Fallen, MD   1 tablet at 11/30/19 1821  . ondansetron (ZOFRAN) tablet 4 mg  4 mg Oral Q6H PRN Crosley, Debby, MD       Or  . ondansetron (ZOFRAN) injection 4 mg  4 mg Intravenous Q6H PRN Crosley, Debby, MD      . polyethylene glycol (MIRALAX / GLYCOLAX) packet 17 g  17 g Oral Daily PRN Crosley, Debby, MD      . QUEtiapine (SEROQUEL) tablet 100 mg  100 mg Oral BID Jannifer Franklin, Avril Busser, MD      . Melene Muller ON 12/02/2019] sertraline (ZOLOFT) tablet 100 mg  100 mg Oral Daily Reyes Fifield, MD      . sodium chloride 0.9 % 1,000 mL with thiamine 100 mg, folic acid 1 mg, multivitamins adult 10 mL infusion   Intravenous Daily Azucena Fallen, MD 100 mL/hr at 11/30/19 1819 New Bag at 11/30/19 1819    Musculoskeletal: Strength & Muscle Tone: within normal limits Gait & Station: normal Patient leans: N/A  Psychiatric Specialty Exam: Physical Exam Psychiatric:        Attention and Perception: She is inattentive.        Mood and Affect: Affect is labile and angry.        Speech: Speech is rapid and pressured and tangential.        Behavior: Behavior is uncooperative and agitated.        Thought Content: Thought content is paranoid and delusional.        Cognition and Memory: Cognition normal.        Judgment: Judgment is impulsive.     Review of Systems  Constitutional: Negative.   HENT: Negative.   Psychiatric/Behavioral: Positive for agitation and sleep disturbance.    Blood pressure (!) 191/99, pulse 91, temperature 98.9 F (37.2 C), temperature source Oral, resp. rate 20, height 5\' 7"  (1.702 m), weight 56.7 kg, SpO2 99 %.Body mass index is 19.56 kg/m.  General Appearance: Casual  Eye Contact:  Fair   Speech:  Pressured  Volume:  Increased  Mood:  Irritable  Affect:  Labile  Thought Process:  Disorganized  Orientation:  Full (Time, Place, and Person)  Thought Content:  Delusions, Paranoid Ideation and Tangential  Suicidal Thoughts:  unknown-patient refused to answer  Homicidal Thoughts:  No  Memory:  Immediate;   Fair Recent;   Fair Remote;   Fair  Judgement:  Impaired  Insight:  Lacking  Psychomotor Activity:  Increased and Restlessness  Concentration:  Concentration: Poor and Attention Span: Poor  Recall:  [employer] of Knowledge:  Fair  Language:  Good  Akathisia:  No  Handed:  Right  AIMS (if indicated):     Assets:  Architect Housing Social Support Others:  family support  ADL's:  Intact  Cognition:  WNL  Sleep:   poor     Treatment Plan Summary: 64 year old female with prior history of mental illness, Cannabis use disorder who was admitted due to paranoia and confusion.On admission, patient is extremely labile, hypomanic, easily agitated, delusional with disorganized thought process. She will benefit from inpatient geriatric psychiatric admission after she is medically stabilized.  Recommendations: -Continue 1:1 sitter for safety -Decrease Zoloft from 200 mg to 100 mg due to increased agitation/manic behavior. -Increased Seroquel to 100 mg bid for  manic behavior/delusions, increase to 200 mg BID if there is no improvement by 12/02/19 -Consider Lorazepam  BID for agitation x 48 hrs -Consider Haloperidol 5 mg po or IM if patient refused po every 8 hrs as needed for agitation. -Consider social worker referral to facilitate psychiatric inpatient placement after patient is medically cleared. -Consider IVC if patient refused Voluntary admission.  Disposition: Recommend psychiatric Inpatient admission when medically cleared. Supportive therapy provided about ongoing stressors. Psychiatric service is signing off. Re-consult  as needed  Thedore Mins, MD 12/01/2019 11:00 AM

## 2019-12-01 NOTE — Discharge Summary (Signed)
Physician Discharge Summary  Kathleen [name] IOM:[id number] DOB: February 17, 1956 DOA: 11/29/2019  PCP: Mosetta Putt, MD  Admit date: 11/29/2019 Discharge date: 12/01/2019  Admitted From: Home Disposition: Inpatient psych  Discharge Condition: Guarded CODE STATUS: Full Diet recommendation: As tolerated  Brief/Interim Summary: This a 64 year old female who was [facility] for with reports of paranoia and confusion. History provided by husband to [name] includes symptoms starting approximately 5 days ago where patient has been acting abnormally. She has been acting paranoid, doing a factoryreset on the computer and phonebecause she was concerned about her the data.Telling people that the gas line outside was about to blow. Apparently she has been running around plugging out appliances and opening dishwasher before going to bed and stating she has a answer. Her husband finally brought her into [facility]The patient has no history of hypertension but her blood pressure199/99at its highest. She received several 10 mg doses of IV labetalol, her sister blood pressure remains above 170. Additionally her UDS was positive for marijuana and Xanax. She does have Xanax on her med list.To me the patient stated she does daily marijuana and has increased her usage recently. She also adds that she received a package in the mail from [place] which was open. She states she is knew not to but she used it anyway.When patient initially arrived she had word salad and pressured speech which is mostly nonsensical. This is improved somewhat.The initial interview she appeared coherent but this devolvedas interview progressed. Unsuccessful attempts made to contact patient's husband.  Patient is medically cleared for discharge to inpatient psych once bed has been made available.  Recommendations per psychiatry: "-Continue 1:1 sitter for safety -Decrease Zoloft from 200 mg to 100 mg due  to increased agitation/manic behavior. -Increased Seroquel to 100 mg bid for manic behavior/delusions, increase to 200 mg BID if there is no improvement by 12/02/19 -Consider Lorazepam 1mg  BID for agitation x 48 hrs -Consider Haloperidol 5 mg po or IM if patient refused po every 8 hrs as needed for agitation. -Consider social worker referral to facilitate psychiatric inpatient placement after patient is medically cleared. -Consider IVC if patient refused Voluntary admission.  Disposition: Recommend psychiatric Inpatient admission when medically cleared. Supportive therapy provided about ongoing stressors. Psychiatric service is signing off. Re-consult as needed"   Discharge Diagnoses:  Principal Problem:   Bipolar I disorder, current or most recent episode hypomanic (HCC) Active Problems:   Hypothyroidism   GERD   Hypercholesterolemia   Acute metabolic encephalopathy   Altered mental status   Marijuana use, continuous   HTN (hypertension), malignant   Cannabis abuse with psychotic disorder, with delusions (HCC)   Acute metabolic encephalopathy, likely the setting of acute bipolar 1 disorder per psychiatry, POA Appreciate psychiatry input and recommendations, continue Zoloft, Seroquel, as needed lorazepam.  Hypertension in the setting of agitation - Blood pressure max 199/99 -these are during agitation or manic events and unlikely accurate given movement during measurement -blood pressure 140/70 overnight/early morning while resting comfortably, this is likely more accurate - Patient was placed on carvedilol and lisinopril but continues to refuse his medications, despite that her blood pressure repeated later this morning was 150/80 without medication treatment while not agitated, certainly room for improvement but unfortunately unable to convince patient to initiate new blood pressure medication in the setting of above.  Polysubstance use Continue Zoloft Seroquel and as needed  lorazepam per psychiatry, she insight and recommendations  Hypothyroidism, stable - Home Synthroid, TSH within normal limits  Depression - Resume Zoloft  Hypercholesterolemia - Resume Zetia  DVT prophylaxis: Lovenox Code Status: Full Family Communication: Husband at bedside Status is: Inpatient  Dispo: The patient is from: Home  Anticipated d/c is to:  Inpatient psych  Anticipated d/c date is: Likely 24 to 48 hours  Patient currently medically stable for discharge, awaiting inpatient psych bed  Consultants:   Psych  Procedures:   None planned  Antimicrobials:  None indicated  Subjective: Overnight and early this morning patient continues to be agitated attempting to flee, fire alarm this morning caused patient to exclaim there was a nuclear attack, very flight of ideas this morning, confabulating much of her history without very meaningful conversation. Husband at bedside assisting with calming the patient. Review of systems otherwise unable to be obtained given patient's mental status and inability to answer questions appropriately.    Discharge Instructions  Allergies  Allergen Reactions  . Clindamycin/Lincomycin Swelling    Gi problems and made throat close up after 7 days    Consultations: Psychiatry  Procedures/Studies: DG Chest 2 View  Result Date: 11/29/2019 CLINICAL DATA:  Altered mental status for 2 days EXAM: CHEST - 2 VIEW COMPARISON:  None FINDINGS: Trachea is midline. Cardiomediastinal contours and hilar structures are normal. Lungs are clear.  No sign of pleural effusion. Visualized skeletal structures with limited assessment without acute process. IMPRESSION: No acute cardiopulmonary disease. Electronically Signed   By: Donzetta KohutGeoffrey  Wile M.D.   On: 11/29/2019 16:12   CT Head Wo Contrast  Result Date: 11/29/2019 CLINICAL DATA:  Mental status change, elevated blood pressure EXAM: CT HEAD WITHOUT CONTRAST  TECHNIQUE: Contiguous axial images were obtained from the base of the skull through the vertex without intravenous contrast. COMPARISON:  None FINDINGS: Brain: No evidence of acute infarction, hemorrhage, hydrocephalus, extra-axial collection or mass lesion/mass effect. Signs of atrophy and chronic microvascular ischemic change. Vascular: No hyperdense vessel or unexpected calcification. Skull: Normal. Negative for fracture or focal lesion. Sinuses/Orbits: Visualized paranasal sinuses and orbits are normal. Other: None IMPRESSION: 1. No acute intracranial pathology. 2. Signs of atrophy and chronic microvascular ischemic change. Electronically Signed   By: Donzetta KohutGeoffrey  Wile M.D.   On: 11/29/2019 16:11   MR BRAIN WO CONTRAST  Result Date: 11/30/2019 CLINICAL DATA:  Mental status change, unknown cause, possible PRESS EXAM: MRI HEAD WITHOUT CONTRAST TECHNIQUE: Multiplanar, multiecho pulse sequences of the brain and surrounding structures were obtained without intravenous contrast. COMPARISON:  Head CT 11/29/2019. FINDINGS: Brain: Cerebral volume is normal for age. No focal parenchymal signal abnormality is identified. There is no acute infarct. No evidence of intracranial mass. No chronic intracranial blood products. No extra-axial fluid collection. No midline shift. Vascular: Expected proximal arterial flow voids. Skull and upper cervical spine: No focal marrow lesion. Sinuses/Orbits: Visualized orbits show no acute finding. No significant paranasal sinus disease or mastoid effusion at the imaged levels. IMPRESSION: Unremarkable non-contrast MRI appearance of the brain. No evidence of acute intracranial abnormality, including acute infarction. There is no MR imaging evidence of posterior reversible encephalopathy syndrome. Electronically Signed   By: Jackey LogeKyle  Golden DO   On: 11/30/2019 18:04     Subjective: Continues to be verbally abusive, noncompliant and somewhat combative angry with staff overnight and this am.  ROS otherwise unable to be reviewed.   Discharge Exam: Vitals:   12/01/19 0730 12/01/19 1113  BP: (!) 191/99 (!) 164/124  Pulse: 91 100  Resp: 20 20  Temp: 98.9 F (37.2 C) 99.1 F (37.3 C)  SpO2: 99%  97%   Vitals:   11/30/19 2322 12/01/19 0355 12/01/19 0730 12/01/19 1113  BP:  (!) 171/92 (!) 191/99 (!) 164/124  Pulse: 70 79 91 100  Resp: 19 20 20 20   Temp: 99.3 F (37.4 C) 99.4 F (37.4 C) 98.9 F (37.2 C) 99.1 F (37.3 C)  TempSrc: Oral Oral Oral Oral  SpO2: 98% 99% 99% 97%  Weight:      Height:        General: Pt is alert, awake, markedly agitated, continues to flail her arms, speak in short sentences, commenting on things that she sees or events that may not have occurred, continues to attempt to get out of bed despite further discussion with husband. Unable to assess orientation at this time. Cardiovascular: RRR, no JVD Respiratory: Without accessory muscle use or tachypnea Abdominal: Nondistended, Extremities: Without overt edema or cyanosis or clubbing Psych: Agitated, anxious, somewhat manic without intact insight    The results of significant diagnostics from this hospitalization (including imaging, microbiology, ancillary and laboratory) are listed below for reference.     Microbiology: Recent Results (from the past 240 hour(s))  SARS Coronavirus 2 by RT PCR (hospital order, performed in [facility] hospital lab) Nasopharyngeal Nasopharyngeal Swab     Status: None   Collection Time: 11/29/19  3:05 PM   Specimen: Nasopharyngeal Swab  Result Value Ref Range Status   SARS Coronavirus 2 NEGATIVE NEGATIVE Final    Comment: (NOTE) SARS-CoV-2 target nucleic acids are NOT DETECTED.  The SARS-CoV-2 RNA is generally detectable in upper and lower respiratory specimens during the acute phase of infection. The lowest concentration of SARS-CoV-2 viral copies this assay can detect is 250 copies / mL. A negative result does not preclude SARS-CoV-2 infection and  should not be used as the sole basis for treatment or other patient management decisions.  A negative result may occur with improper specimen collection / handling, submission of specimen other than nasopharyngeal swab, presence of viral mutation(s) within the areas targeted by this assay, and inadequate number of viral copies (<250 copies / mL). A negative result must be combined with clinical observations, patient history, and epidemiological information.  Fact Sheet for Patients:   12/01/19  Fact Sheet for Healthcare Providers: BoilerBrush.com.cy  This test is not yet approved or  cleared by the https://pope.com/ FDA and has been authorized for detection and/or diagnosis of SARS-CoV-2 by FDA under an Emergency Use Authorization (EUA).  This EUA will remain in effect (meaning this test can be used) for the duration of the COVID-19 declaration under Section 564(b)(1) of the Act, 21 U.S.C. section 360bbb-3(b)(1), unless the authorization is terminated or revoked sooner.  Performed at [facility], [address] [place], [place]   Urine culture     Status: Abnormal   Collection Time: 11/29/19  5:45 PM   Specimen: Urine, Random  Result Value Ref Range Status   Specimen Description   Final    URINE, RANDOM Performed at [facility], [address] [place], [place]    Special Requests   Final    NONE Performed at [facility], [address] [place], [place]    Culture MULTIPLE SPECIES PRESENT, SUGGEST RECOLLECTION (A)  Final   Report Status 12/01/2019 FINAL  Final     Labs: BNP (last 3 results) No results for input(s): BNP in the last 8760 hours. Basic Metabolic Panel: Recent Labs  Lab 11/29/19 1427 11/30/19  0132 12/01/19 0215  NA 138 136 135  K 3.4* 3.4* 3.5  CL 97* 98 101  CO2 GLUCOSE 149* 114* 105*  BUN 7* 5* 9  CREATININE 0.55 0.55  0.58  CALCIUM 9.9 9.7 9.3   Liver Function Tests: Recent Labs  Lab 11/29/19 1427 12/01/19 0215  AST 16 16  ALT 11 10  ALKPHOS 68 56  BILITOT 0.7 0.7  PROT 8.8* 7.0  ALBUMIN 5.5* 4.3   Recent Labs  Lab 11/29/19 1427  LIPASE 20   Recent Labs  Lab 11/29/19 1427  AMMONIA 18   CBC: Recent Labs  Lab 11/29/19 1427 11/30/19 0132 12/01/19 0215  WBC 13.9* 13.1* 12.4*  NEUTROABS 12.6*  --   --   HGB 15.1* 14.4 14.0  HCT 45.2 43.4 41.5  MCV 89.3 89.1 89.6  PLT 305 274 268   Cardiac Enzymes: No results for input(s): CKTOTAL, CKMB, CKMBINDEX, TROPONINI in the last 168 hours. BNP: Invalid input(s): POCBNP CBG: Recent Labs  Lab 11/29/19 1403  GLUCAP 137*   D-Dimer No results for input(s): DDIMER in the last 72 hours. Hgb A1c No results for input(s): HGBA1C in the last 72 hours. Lipid Profile No results for input(s): CHOL, HDL, LDLCALC, TRIG, CHOLHDL, LDLDIRECT in the last 72 hours. Thyroid function studies Recent Labs    11/30/19 0132  TSH 1.959   Anemia work up No results for input(s): VITAMINB12, FOLATE, FERRITIN, TIBC, IRON, RETICCTPCT in the last 72 hours. Urinalysis    Component Value Date/Time   COLORURINE YELLOW 11/29/2019 1745   APPEARANCEUR CLEAR 11/29/2019 1745   LABSPEC >1.030 (H) 11/29/2019 1745   PHURINE 6.0 11/29/2019 1745   GLUCOSEU NEGATIVE 11/29/2019 1745   HGBUR NEGATIVE 11/29/2019 1745   BILIRUBINUR NEGATIVE 11/29/2019 1745   KETONESUR >80 (A) 11/29/2019 1745   PROTEINUR 100 (A) 11/29/2019 1745   NITRITE NEGATIVE 11/29/2019 1745   LEUKOCYTESUR NEGATIVE 11/29/2019 1745   Sepsis Labs Invalid input(s): PROCALCITONIN,  WBC,  LACTICIDVEN Microbiology Recent Results (from the past 240 hour(s))  SARS Coronavirus 2 by RT PCR (hospital order, performed in [facility] hospital lab) Nasopharyngeal Nasopharyngeal Swab     Status: None   Collection Time: 11/29/19  3:05 PM   Specimen: Nasopharyngeal Swab  Result Value Ref Range Status   SARS  Coronavirus 2 NEGATIVE NEGATIVE Final    Comment: (NOTE) SARS-CoV-2 target nucleic acids are NOT DETECTED.  The SARS-CoV-2 RNA is generally detectable in upper and lower respiratory specimens during the acute phase of infection. The lowest concentration of SARS-CoV-2 viral copies this assay can detect is 250 copies / mL. A negative result does not preclude SARS-CoV-2 infection and should not be used as the sole basis for treatment or other patient management decisions.  A negative result may occur with improper specimen collection / handling, submission of specimen other than nasopharyngeal swab, presence of viral mutation(s) within the areas targeted by this assay, and inadequate number of viral copies (<250 copies / mL). A negative result must be combined with clinical observations, patient history, and epidemiological information.  Fact Sheet for Patients:   BoilerBrush.com.cy  Fact Sheet for Healthcare Providers: https://pope.com/  This test is not yet approved or  cleared by the [place] FDA and has been authorized for detection and/or diagnosis of SARS-CoV-2 by FDA under an Emergency Use Authorization (EUA).  This EUA will remain in effect (meaning this test can be used) for the duration of the COVID-19 declaration under Section 564(b)(1) of  the Act, 21 U.S.C. section 360bbb-3(b)(1), unless the authorization is terminated or revoked sooner.  Performed at [facility], [address] [place], [place] [address]   Urine culture     Status: Abnormal   Collection Time: 11/29/19  5:45 PM   Specimen: Urine, Random  Result Value Ref Range Status   Specimen Description   Final    URINE, RANDOM Performed at [facility], [address] [place], [place] [address]    Special Requests   Final    NONE Performed at [facility], [address] [place], [place] [address]    Culture MULTIPLE  SPECIES PRESENT, SUGGEST RECOLLECTION (A)  Final   Report Status 12/01/2019 FINAL  Final     Time coordinating discharge: Over 30 minutes  SIGNED:   Azucena Fallen, DO [facility] 12/01/2019, 4:00 PM Pager   If 7PM-7AM, please contact night-coverage [email or web address]

## 2019-12-01 NOTE — Social Work (Addendum)
3:00p CSW received IVC paperwork from the Magistrate. CSW placed 4 copies on patient's chart and called [facility] emergency services to have patient served. CSW faxed referrals to the following placements:  [facility]  Awaiting follow-up.   1:50p  CSW consulted for referral to inpatient psychiatric patient at discharge.  CSW spoke with patient's husband Maisie Fus bedside and explained involuntary commitment is being completed, due to patient being unable to currently voluntarily commit. CSW explained that referrals will be sent to geriatric psych placements, Maisie Fus expressed understanding.   CSW completed IVC paperwork and faxed to [facility], awaiting return fax.  Verlon Au, LCSWA Clinical Social Worker

## 2019-12-01 NOTE — Plan of Care (Signed)
  Problem: Activity: Goal: Risk for activity intolerance will decrease Outcome: Progressing   Problem: Coping: Goal: Level of anxiety will decrease Outcome: Progressing   

## 2019-12-01 NOTE — Progress Notes (Signed)
Patient combative, argumentative, and has erratic behavior.  Patient is refusing all treatment including blood pressure medications.

## 2019-12-02 LAB — CBC
HCT: 38.9 % (ref 36.0–46.0)
Hemoglobin: 13 g/dL (ref 12.0–15.0)
MCH: 29.8 pg (ref 26.0–34.0)
MCHC: 33.4 g/dL (ref 30.0–36.0)
MCV: 89.2 fL (ref 80.0–100.0)
Platelets: 208 10*3/uL (ref 150–400)
RBC: 4.36 MIL/uL (ref 3.87–5.11)
RDW: 13.2 % (ref 11.5–15.5)
WBC: 9.4 10*3/uL (ref 4.0–10.5)
nRBC: 0 % (ref 0.0–0.2)

## 2019-12-02 LAB — COMPREHENSIVE METABOLIC PANEL
ALT: 12 U/L (ref 0–44)
AST: 21 U/L (ref 15–41)
Albumin: 3.9 g/dL (ref 3.5–5.0)
Alkaline Phosphatase: 51 U/L (ref 38–126)
Anion gap: 11 (ref 5–15)
BUN: 12 mg/dL (ref 8–23)
CO2: 24 mmol/L (ref 22–32)
Calcium: 8.9 mg/dL (ref 8.9–10.3)
Chloride: 95 mmol/L — ABNORMAL LOW (ref 98–111)
Creatinine, Ser: 0.61 mg/dL (ref 0.44–1.00)
GFR calc Af Amer: >60 mL/min (ref >60)
GFR calc non Af Amer: >60 mL/min (ref >60)
Glucose, Bld: 106 mg/dL — ABNORMAL HIGH (ref 70–99)
Potassium: 3.8 mmol/L (ref 3.5–5.1)
Sodium: 130 mmol/L — ABNORMAL LOW (ref 135–145)
Total Bilirubin: 1 mg/dL (ref 0.3–1.2)
Total Protein: 6.1 g/dL — ABNORMAL LOW (ref 6.5–8.1)

## 2019-12-02 MED ORDER — QUETIAPINE FUMARATE 100 MG PO TABS: 100.0000 mg | ORAL_TABLET | Freq: Two times a day (BID) | ORAL | 0 refills | Status: DC

## 2019-12-02 MED ORDER — ADULT MULTIVITAMIN W/MINERALS CH: 1.0000 | ORAL_TABLET | Freq: Every day | ORAL | 0 refills | Status: DC

## 2019-12-02 MED ORDER — CARVEDILOL 6.25 MG PO TABS: 6.2500 mg | ORAL_TABLET | Freq: Two times a day (BID) | ORAL | 0 refills | Status: DC

## 2019-12-02 MED ORDER — HALOPERIDOL 5 MG PO TABS: 5.0000 mg | ORAL_TABLET | Freq: Three times a day (TID) | ORAL | 0 refills | Status: DC | PRN

## 2019-12-02 MED FILL — QUETIAPINE FUMARATE 100 MG: 100 | 30 days supply | Qty: 60 | Fill #0

## 2019-12-02 MED FILL — HALOPERIDOL 5 MG TABS: 5 | 10 days supply | Qty: 30 | Fill #0

## 2019-12-02 MED FILL — CARVEDILOL 6.25 MG TABLET: 6.25 | 30 days supply | Qty: 60 | Fill #0

## 2019-12-02 NOTE — Progress Notes (Signed)
Patient refuse labs at this time, stated she want to sleep, will retry later

## 2019-12-02 NOTE — Progress Notes (Signed)
Attempted to call Kathleen [name] three times at the phone number provdided to give report on patient but there was no answer, the phone just kept ringing.  Discharge instructions and medication reconciliation was provided to patients husband and the police offers who transported the patient from her room.

## 2019-12-02 NOTE — TOC Transition Note (Signed)
Transition of Care [facility]) - CM/SW Discharge Note   Patient Details  Name: Jenica Costilow MRN: [id number] Date of Birth: 1955-06-10  [facility] CM/SW Contact:  Baldemar Lenis, LCSW Phone Number: 12/02/2019, 11:47 AM   Clinical Narrative:   Patient discharging to [facility]. Accepting physician is Dr. Forrestine Him. Patient will be going to [facility], [address].  Nurse to call report to [phone].     Final next level of care: Psychiatric Hospital Barriers to Discharge: Barriers Resolved   Patient Goals and CMS Choice        Discharge Placement                       Discharge Plan and Services                                     Social Determinants of Health (SDOH) Interventions     Readmission Risk Interventions No flowsheet data found.

## 2019-12-02 NOTE — Discharge Summary (Signed)
Physician Discharge Summary  Kathleen [name] ZOX:[id number] DOB: 05-03-1955 DOA: 11/29/2019  PCP: Mosetta Putt, MD  Admit date: 11/29/2019 Discharge date: 12/02/2019  Admitted From: Home Disposition: Inpatient psych  Discharge Condition: Guarded CODE STATUS: Full Diet recommendation: As tolerated  Brief/Interim Summary: This a 64 year old female who was [facility] for with reports of paranoia and confusion. History provided by husband to [name] includes symptoms starting approximately 5 days ago where patient has been acting abnormally. She has been acting paranoid, doing a factoryreset on the computer and phonebecause she was concerned about her the data.Telling people that the gas line outside was about to blow. Apparently she has been running around plugging out appliances and opening dishwasher before going to bed and stating she has a answer. Her husband finally brought her into [name].The patient has no history of hypertension but her blood pressure199/99at its highest. She received several 10 mg doses of IV labetalol, her sister blood pressure remains above 170. Additionally her UDS was positive for marijuana and Xanax. She does have Xanax on her med list.To me the patient stated she does daily marijuana and has increased her usage recently. She also adds that she received a package in the mail from [place] which was open. She states she is knew not to but she used it anyway.When patient initially arrived she had word salad and pressured speech which is mostly nonsensical. This is improved somewhat.The initial interview she appeared coherent but this devolvedas interview progressed. Unsuccessful attempts made to contact patient's husband.  Patient is medically cleared for discharge to inpatient psych.  Patient admitted as above with acute mental status changes and behavior changes.  Initial concern for polypharmacy, infectious process or other  organic cause.  Fortunately patient's imaging, lab work and evaluation appears to be unremarkable.  Patient did have moderately elevated blood pressure at intake as well as overnight during events of agitation.  While off medications patient's blood pressure is more markedly well controlled around 140/70, while still meeting criteria for hypertension patient has been placed on carvedilol and lisinopril to assist in bringing her blood pressure down but she continues to be noncompliant with medications. Psychiatry was consulted due to concern for acute psychotic break, agreeing the patient would benefit from inpatient psychiatric care with likely diagnosis including bipolar 1 disorder certainly fits patient's acute changes in behavior.  She is currently well controlled on occasions per psychiatry including Seroquel, Zoloft, Haldol.  Lengthy discussion today with patient and husband at bedside about disposition and further care at inpatient facility, they are otherwise agreeable and stable for discharge.   Discharge Diagnoses:  Principal Problem:   Bipolar I disorder, current or most recent episode hypomanic (HCC) Active Problems:   Hypothyroidism   GERD   Hypercholesterolemia   Acute metabolic encephalopathy   Altered mental status   Marijuana use, continuous   HTN (hypertension), malignant   Cannabis abuse with psychotic disorder, with delusions [facility])    Discharge Instructions  Discharge Instructions    Diet - low sodium heart healthy   Complete by: As directed    Increase activity slowly   Complete by: As directed    Increase activity slowly   Complete by: As directed      Allergies as of 12/02/2019      Reactions   Clindamycin/lincomycin Swelling   Gi problems and made throat close up after 7 days      Medication List    STOP taking these medications   ALPRAZolam  0.5 MG tablet Commonly known as: XANAX   estazolam 2 MG tablet Commonly known as: PROSOM     TAKE these  medications   aspirin EC 81 MG tablet Take 81 mg by mouth daily.   carvedilol 6.25 MG tablet Commonly known as: COREG Take 1 tablet (6.25 mg total) by mouth 2 (two) times daily with a meal.   ezetimibe 10 MG tablet Commonly known as: ZETIA Take 10 mg by mouth daily.   haloperidol 5 MG tablet Commonly known as: HALDOL Take 1 tablet (5 mg total) by mouth every 8 (eight) hours as needed for up to 10 days for agitation.   levothyroxine 150 MCG tablet Commonly known as: SYNTHROID Take 150 mcg by mouth daily.   multivitamin with minerals Tabs tablet Take 1 tablet by mouth daily.   QUEtiapine 100 MG tablet Commonly known as: SEROQUEL Take 1 tablet (100 mg total) by mouth 2 (two) times daily.   Repatha SureClick 140 MG/ML Soaj Generic drug: Evolocumab Inject 140 mg into the skin every 14 (fourteen) days.   sertraline 100 MG tablet Commonly known as: ZOLOFT Take 200 mg by mouth daily.   Vitamin D3 125 MCG (5000 UT) Caps Take 5,000 Units by mouth daily. Reported on 09/24/2015   Zinc 50 MG Tabs Take 1 tablet by mouth daily.      Consultations: Psychiatry  Procedures/Studies: DG Chest 2 View  Result Date: 11/29/2019 CLINICAL DATA:  Altered mental status for 2 days EXAM: CHEST - 2 VIEW COMPARISON:  None FINDINGS: Trachea is midline. Cardiomediastinal contours and hilar structures are normal. Lungs are clear.  No sign of pleural effusion. Visualized skeletal structures with limited assessment without acute process. IMPRESSION: No acute cardiopulmonary disease. Electronically Signed   By: Donzetta Kohut M.D.   On: 11/29/2019 16:12   CT Head Wo Contrast  Result Date: 11/29/2019 CLINICAL DATA:  Mental status change, elevated blood pressure EXAM: CT HEAD WITHOUT CONTRAST TECHNIQUE: Contiguous axial images were obtained from the base of the skull through the vertex without intravenous contrast. COMPARISON:  None FINDINGS: Brain: No evidence of acute infarction, hemorrhage,  hydrocephalus, extra-axial collection or mass lesion/mass effect. Signs of atrophy and chronic microvascular ischemic change. Vascular: No hyperdense vessel or unexpected calcification. Skull: Normal. Negative for fracture or focal lesion. Sinuses/Orbits: Visualized paranasal sinuses and orbits are normal. Other: None IMPRESSION: 1. No acute intracranial pathology. 2. Signs of atrophy and chronic microvascular ischemic change. Electronically Signed   By: Donzetta Kohut M.D.   On: 11/29/2019 16:11   MR BRAIN WO CONTRAST  Result Date: 11/30/2019 CLINICAL DATA:  Mental status change, unknown cause, possible PRESS EXAM: MRI HEAD WITHOUT CONTRAST TECHNIQUE: Multiplanar, multiecho pulse sequences of the brain and surrounding structures were obtained without intravenous contrast. COMPARISON:  Head CT 11/29/2019. FINDINGS: Brain: Cerebral volume is normal for age. No focal parenchymal signal abnormality is identified. There is no acute infarct. No evidence of intracranial mass. No chronic intracranial blood products. No extra-axial fluid collection. No midline shift. Vascular: Expected proximal arterial flow voids. Skull and upper cervical spine: No focal marrow lesion. Sinuses/Orbits: Visualized orbits show no acute finding. No significant paranasal sinus disease or mastoid effusion at the imaged levels. IMPRESSION: Unremarkable non-contrast MRI appearance of the brain. No evidence of acute intracranial abnormality, including acute infarction. There is no MR imaging evidence of posterior reversible encephalopathy syndrome. Electronically Signed   By: Jackey Loge DO   On: 11/30/2019 18:04     Subjective: Continues to be verbally  abusive, noncompliant and somewhat combative angry with staff overnight and this am. ROS otherwise unable to be reviewed.   Discharge Exam: Vitals:   12/02/19 0549 12/02/19 0850  BP: (!) 148/81 (!) 137/83  Pulse: 79 92  Resp: 18 19  Temp: 98.9 F (37.2 C) 98.4 F (36.9 C)  SpO2:  100% 97%   Vitals:   12/01/19 1731 12/01/19 2058 12/02/19 0549 12/02/19 0850  BP: (!) 94/57 109/67 (!) 148/81 (!) 137/83  Pulse: 97 68 79 92  Resp: 18 18 18 19   Temp: 99.8 F (37.7 C) 98.9 F (37.2 C) 98.9 F (37.2 C) 98.4 F (36.9 C)  TempSrc: Oral Oral  Oral  SpO2: 99% 100% 100% 97%  Weight:      Height:        General: Pt is alert, awake, markedly agitated, continues to flail her arms, speak in short sentences, commenting on things that she sees or events that may not have occurred, continues to attempt to get out of bed despite further discussion with husband. Unable to assess orientation at this time. Cardiovascular: RRR, no JVD Respiratory: Without accessory muscle use or tachypnea Abdominal: Nondistended, Extremities: Without overt edema or cyanosis or clubbing Psych: Agitated, anxious, somewhat manic without intact insight    The results of significant diagnostics from this hospitalization (including imaging, microbiology, ancillary and laboratory) are listed below for reference.     Microbiology: Recent Results (from the past 240 hour(s))  SARS Coronavirus 2 by RT PCR (hospital order, performed in [facility] hospital lab) Nasopharyngeal Nasopharyngeal Swab     Status: None   Collection Time: 11/29/19  3:05 PM   Specimen: Nasopharyngeal Swab  Result Value Ref Range Status   SARS Coronavirus 2 NEGATIVE NEGATIVE Final    Comment: (NOTE) SARS-CoV-2 target nucleic acids are NOT DETECTED.  The SARS-CoV-2 RNA is generally detectable in upper and lower respiratory specimens during the acute phase of infection. The lowest concentration of SARS-CoV-2 viral copies this assay can detect is 250 copies / mL. A negative result does not preclude SARS-CoV-2 infection and should not be used as the sole basis for treatment or other patient management decisions.  A negative result may occur with improper specimen collection / handling, submission of specimen other than  nasopharyngeal swab, presence of viral mutation(s) within the areas targeted by this assay, and inadequate number of viral copies (<250 copies / mL). A negative result must be combined with clinical observations, patient history, and epidemiological information.  Fact Sheet for Patients:   12/01/19  Fact Sheet for Healthcare Providers: BoilerBrush.com.cy  This test is not yet approved or  cleared by the https://pope.com/ FDA and has been authorized for detection and/or diagnosis of SARS-CoV-2 by FDA under an Emergency Use Authorization (EUA).  This EUA will remain in effect (meaning this test can be used) for the duration of the COVID-19 declaration under Section 564(b)(1) of the Act, 21 U.S.C. section 360bbb-3(b)(1), unless the authorization is terminated or revoked sooner.  Performed at [facility], [address] [place], [place]   Urine culture     Status: Abnormal   Collection Time: 11/29/19  5:45 PM   Specimen: Urine, Random  Result Value Ref Range Status   Specimen Description   Final    URINE, RANDOM Performed at [facility], [address] [place], [place]    Special Requests   Final    NONE Performed at [facility],  [address] [place], [place] [id number]    Culture MULTIPLE SPECIES PRESENT, SUGGEST RECOLLECTION (A)  Final   Report Status 12/01/2019 FINAL  Final     Labs: BNP (last 3 results) No results for input(s): BNP in the last 8760 hours. Basic Metabolic Panel: Recent Labs  Lab 11/29/19 1427 11/30/19 0132 12/01/19 0215 12/02/19 0740  NA 138 136 135 130*  K 3.4* 3.4* 3.5 3.8  CL 97* 98 101 95*  CO2 23 23 23 24   GLUCOSE 149* 114* 105* 106*  BUN 7* 5* 9 12  CREATININE 0.55 0.55 0.58 0.61  CALCIUM 9.9 9.7 9.3 8.9   Liver Function Tests: Recent Labs  Lab 11/29/19 1427 12/01/19 0215 12/02/19 0740  AST 16 16 21   ALT 11 10 12    ALKPHOS 68 56 51  BILITOT 0.7 0.7 1.0  PROT 8.8* 7.0 6.1*  ALBUMIN 5.5* 4.3 3.9   Recent Labs  Lab 11/29/19 1427  LIPASE 20   Recent Labs  Lab 11/29/19 1427  AMMONIA 18   CBC: Recent Labs  Lab 11/29/19 1427 11/30/19 0132 12/01/19 0215 12/02/19 0740  WBC 13.9* 13.1* 12.4* 9.4  NEUTROABS 12.6*  --   --   --   HGB 15.1* 14.4 14.0 13.0  HCT 45.2 43.4 41.5 38.9  MCV 89.3 89.1 89.6 89.2  PLT 305 274 268 208   Cardiac Enzymes: No results for input(s): CKTOTAL, CKMB, CKMBINDEX, TROPONINI in the last 168 hours. BNP: Invalid input(s): POCBNP CBG: Recent Labs  Lab 11/29/19 1403  GLUCAP 137*   D-Dimer No results for input(s): DDIMER in the last 72 hours. Hgb A1c No results for input(s): HGBA1C in the last 72 hours. Lipid Profile No results for input(s): CHOL, HDL, LDLCALC, TRIG, CHOLHDL, LDLDIRECT in the last 72 hours. Thyroid function studies Recent Labs    11/30/19 0132  TSH 1.959   Anemia work up No results for input(s): VITAMINB12, FOLATE, FERRITIN, TIBC, IRON, RETICCTPCT in the last 72 hours. Urinalysis    Component Value Date/Time   COLORURINE YELLOW 11/29/2019 1745   APPEARANCEUR CLEAR 11/29/2019 1745   LABSPEC >1.030 (H) 11/29/2019 1745   PHURINE 6.0 11/29/2019 1745   GLUCOSEU NEGATIVE 11/29/2019 1745   HGBUR NEGATIVE 11/29/2019 1745   BILIRUBINUR NEGATIVE 11/29/2019 1745   KETONESUR >80 (A) 11/29/2019 1745   PROTEINUR 100 (A) 11/29/2019 1745   NITRITE NEGATIVE 11/29/2019 1745   LEUKOCYTESUR NEGATIVE 11/29/2019 1745   Sepsis Labs Invalid input(s): PROCALCITONIN,  WBC,  LACTICIDVEN Microbiology Recent Results (from the past 240 hour(s))  SARS Coronavirus 2 by RT PCR (hospital order, performed in [facility] hospital lab) Nasopharyngeal Nasopharyngeal Swab     Status: None   Collection Time: 11/29/19  3:05 PM   Specimen: Nasopharyngeal Swab  Result Value Ref Range Status   SARS Coronavirus 2 NEGATIVE NEGATIVE Final    Comment:  (NOTE) SARS-CoV-2 target nucleic acids are NOT DETECTED.  The SARS-CoV-2 RNA is generally detectable in upper and lower respiratory specimens during the acute phase of infection. The lowest concentration of SARS-CoV-2 viral copies this assay can detect is 250 copies / mL. A negative result does not preclude SARS-CoV-2 infection and should not be used as the sole basis for treatment or other patient management decisions.  A negative result may occur with improper specimen collection / handling, submission of specimen other than nasopharyngeal swab, presence of viral mutation(s) within the areas targeted by this assay, and inadequate number of viral copies (<250 copies / mL). A  negative result must be combined with clinical observations, patient history, and epidemiological information.  Fact Sheet for Patients:   BoilerBrush.com.cy  Fact Sheet for Healthcare Providers: https://pope.com/  This test is not yet approved or  cleared by the [place] FDA and has been authorized for detection and/or diagnosis of SARS-CoV-2 by FDA under an Emergency Use Authorization (EUA).  This EUA will remain in effect (meaning this test can be used) for the duration of the COVID-19 declaration under Section 564(b)(1) of the Act, 21 U.S.C. section 360bbb-3(b)(1), unless the authorization is terminated or revoked sooner.  Performed at [facility], [address] [place], [place] [address]   Urine culture     Status: Abnormal   Collection Time: 11/29/19  5:45 PM   Specimen: Urine, Random  Result Value Ref Range Status   Specimen Description   Final    URINE, RANDOM Performed at [facility], [address] [place], [place] [address]    Special Requests   Final    NONE Performed at [facility], [address] [place], [place] [address]    Culture MULTIPLE SPECIES PRESENT, SUGGEST RECOLLECTION (A)  Final    Report Status 12/01/2019 FINAL  Final     Time coordinating discharge: Over 30 minutes  SIGNED:   Azucena Fallen, DO [facility] 12/02/2019, 10:42 AM Pager   If 7PM-7AM, please contact night-coverage [email or web address]

## 2019-12-02 NOTE — Progress Notes (Signed)
Patient stated she does not want meds going through her IV, IV saline lock at this time.

## 2020-03-30 ENCOUNTER — Telehealth: Payer: Self-pay | Admitting: Cardiovascular Disease

## 2020-03-30 NOTE — Telephone Encounter (Signed)
Pa sent to plan 

## 2020-03-30 NOTE — Telephone Encounter (Signed)
New Message  [facility] is calling for Prior Authorization for Repatha   Please call

## 2020-11-09 ENCOUNTER — Other Ambulatory Visit: Payer: Self-pay | Admitting: Cardiovascular Disease

## 2021-10-22 DIAGNOSIS — I10 Essential (primary) hypertension: Secondary | ICD-10-CM | POA: Diagnosis not present

## 2021-10-22 DIAGNOSIS — Z0000 Encounter for general adult medical examination without abnormal findings: Secondary | ICD-10-CM | POA: Diagnosis not present

## 2021-10-22 DIAGNOSIS — E7800 Pure hypercholesterolemia, unspecified: Secondary | ICD-10-CM | POA: Diagnosis not present

## 2021-10-22 DIAGNOSIS — E039 Hypothyroidism, unspecified: Secondary | ICD-10-CM | POA: Diagnosis not present

## 2021-10-22 DIAGNOSIS — I6522 Occlusion and stenosis of left carotid artery: Secondary | ICD-10-CM | POA: Diagnosis not present

## 2021-11-25 ENCOUNTER — Other Ambulatory Visit: Payer: Self-pay | Admitting: Cardiovascular Disease

## 2021-12-16 ENCOUNTER — Encounter (HOSPITAL_COMMUNITY): Payer: Self-pay

## 2021-12-16 ENCOUNTER — Encounter (HOSPITAL_COMMUNITY): Payer: BC Managed Care – PPO

## 2022-01-18 DIAGNOSIS — H53143 Visual discomfort, bilateral: Secondary | ICD-10-CM | POA: Diagnosis not present

## 2022-01-24 ENCOUNTER — Ambulatory Visit (HOSPITAL_COMMUNITY)
Admission: RE | Admit: 2022-01-24 | Discharge: 2022-01-24 | Disposition: A | Discharge status: Home / Self Care | Payer: BC Managed Care – PPO | Source: Ambulatory Visit | Attending: Surgery | Admitting: Surgery

## 2022-01-24 ENCOUNTER — Other Ambulatory Visit (HOSPITAL_COMMUNITY): Payer: Self-pay | Admitting: Family Medicine

## 2022-01-24 DIAGNOSIS — I6529 Occlusion and stenosis of unspecified carotid artery: Secondary | ICD-10-CM | POA: Insufficient documentation

## 2022-04-01 ENCOUNTER — Telehealth: Payer: Self-pay | Admitting: Cardiovascular Disease

## 2022-04-01 NOTE — Telephone Encounter (Signed)
Pt c/o medication issue:  1. Name of Medication: REPATHA SURECLICK 140 MG/ML SOAJ   2. How are you currently taking this medication (dosage and times per day)? Inject every 14 days  3. Are you having a reaction (difficulty breathing--STAT)? no  4. What is your medication issue? [facility] calling to see if a prior authorization request was received. Phone: [phone] prescription number: [id number]

## 2022-04-04 ENCOUNTER — Telehealth: Payer: Self-pay | Admitting: Cardiovascular Disease

## 2022-04-04 NOTE — Telephone Encounter (Signed)
Pt c/o medication issue:  1. Name of Medication:   REPATHA SURECLICK 140 MG/ML SOAJ    2. How are you currently taking this medication (dosage and times per day)?   ADMINISTER 1 ML UNDER THE SKIN EVERY 14 DAYS    3. Are you having a reaction (difficulty breathing--STAT)? No  4. What is your medication issue? Calling to f/u on Prior Auth

## 2022-04-04 NOTE — Telephone Encounter (Signed)
Patient advised that, per pharmd, she will need an appointment with Dr. Royann Shivers since it has been a few years. Appointment made for 12/6 at 10 am with Dr. Royann Shivers.

## 2022-04-04 NOTE — Telephone Encounter (Signed)
LM for patient to return call, has not had labs done (Epic or KPN) since prior to starting med.  Also has not seen Dr. Salena Saner since 2021, needs to schedule f/u

## 2022-04-06 ENCOUNTER — Ambulatory Visit: Payer: BC Managed Care – PPO | Attending: Cardiovascular Disease | Admitting: Cardiovascular Disease

## 2022-04-06 ENCOUNTER — Encounter: Payer: Self-pay | Admitting: Cardiovascular Disease

## 2022-04-06 VITALS — BP 182/92 | HR 60 | Ht 67.5 in | Wt 141.0 lb

## 2022-04-06 DIAGNOSIS — E0580 Other thyrotoxicosis without thyrotoxic crisis or storm: Secondary | ICD-10-CM

## 2022-04-06 DIAGNOSIS — E7801 Familial hypercholesterolemia: Secondary | ICD-10-CM

## 2022-04-06 DIAGNOSIS — I10 Essential (primary) hypertension: Secondary | ICD-10-CM | POA: Diagnosis not present

## 2022-04-06 MED ORDER — REPATHA SURECLICK 140 MG/ML ~~LOC~~ SOAJ: 1.0000 mL | SUBCUTANEOUS | 11 refills | Status: DC

## 2022-04-06 NOTE — Telephone Encounter (Signed)
PA has been submitted  Key: [id number]

## 2022-04-06 NOTE — Progress Notes (Signed)
Cardiology Office Note    Date:  04/06/2022   ID:  Kathleen [name], DOB 12-17-1955, MRN [id number]   PCP:  Mosetta Putt, MD  Cardiologist:  None Abbas Beyene Electrophysiologist:  None   Evaluation Performed:  Follow-Up Visit  Chief Complaint: Hypercholesterolemia  History of Present Illness:    Kathleen [name] is a 66 y.o. female with severe hypercholesterolemia, almost certainly heterozygous familial hypercholesterolemia, intolerant to multiple statins due to severe myalgia, hypertension controlled with sodium restriction and exercise.  Severely elevated LDL cholesterol baseline 194 and family history of early onset CAD (father age 58 had myocardial infarction).  On carvedilol for hypertension.  Does not have diabetes mellitus and does not smoke.  The patient specifically denies any chest pain at rest exertion, dyspnea at rest or with exertion, orthopnea, paroxysmal nocturnal dyspnea, syncope, palpitations, focal neurological deficits, intermittent claudication, lower extremity edema, unexplained weight gain, cough, hemoptysis or wheezing.  She has an occasional "muscle catch" in the precordial area but this only last for a few seconds and seems to be positional, when she bends over doing housework and dusting.  Recently has been under a lot of emotional stress due to her son's issues.  Occasionally forgets to take the evening dose of carvedilol  Has lost quite a bit of weight.  Suppressed TSH at 0.06 checked 3 months ago, so the dose of levothyroxine was reduced from 150 to 100 mcg daily.  On Repatha she had a marked improvement in LDL cholesterol, but LDL did not really reach target so Zetia 10 mg was started.  On the follow-up labs she has excellent parameters with an LDL cholesterol 53, particle #676 and HDL 67.  The patient does not have symptoms concerning for COVID-19 infection (fever, chills, cough, or new shortness of breath).    Past Medical History:  Diagnosis Date    Clostridium difficile infection    Hyperlipidemia    Thyroid disease    Past Surgical History:  Procedure Laterality Date   ANKLE FRACTURE SURGERY     CERVICAL CONE BIOPSY       Current Meds  Medication Sig   carvedilol (COREG) 6.25 MG tablet Take 1 tablet (6.25 mg total) by mouth 2 (two) times daily with a meal.   levothyroxine (SYNTHROID) 100 MCG tablet Take 100 mcg by mouth daily.   QUEtiapine (SEROQUEL) 100 MG tablet Take 1 tablet (100 mg total) by mouth 2 (two) times daily.   REPATHA SURECLICK 140 MG/ML SOAJ ADMINISTER 1 ML UNDER THE SKIN EVERY 14 DAYS   sertraline (ZOLOFT) 100 MG tablet Take 200 mg by mouth daily.    Zinc 50 MG TABS Take 1 tablet by mouth daily.     Allergies:   Clindamycin/lincomycin   Social History   Tobacco Use   Smoking status: Never   Smokeless tobacco: Never  Substance Use Topics   Alcohol use: No    Alcohol/week: 0.0 standard drinks of alcohol   Drug use: No     Family Hx: The patient's family history includes Deep vein thrombosis in her sister; Heart disease in her father.  ROS:   Please see the history of present illness.     All other systems reviewed and are negative.   Prior CV studies:   The following studies were reviewed today:   Labs/Other Tests and Data Reviewed:    EKG:  No ECG reviewed. Requesting ECG from Dr. Duaine Dredge.  Recent Labs: No results found for requested labs within last 365 days.  10/12/2021 Dr. Duaine Dredge Cholesterol 134, HDL 67 (HDL particle #42), LDL 53 (LDL particle #776, small LDL particle #342), triglycerides 65 ALT 11, hemoglobin A1c 5.4%, hemoglobin 13.9, creatinine 0.54, potassium 4.1  Recent Lipid Panel Lab Results  Component Value Date/Time   CHOL 263 (H) 02/19/2019 10:40 AM   TRIG 86 02/19/2019 10:40 AM   HDL 54 02/19/2019 10:40 AM   CHOLHDL 4.9 (H) 02/19/2019 10:40 AM   CHOLHDL 4 07/20/2016 09:43 AM   LDLCALC 194 (H) 02/19/2019 10:40 AM    Wt Readings from Last 3 Encounters:  04/06/22  64 kg  11/29/19 56.7 kg  05/31/19 69.9 kg     Objective:    Vital Signs:  BP (!) 182/92 (BP Location: Left Arm, Patient Position: Sitting, Cuff Size: Normal)   Pulse 60   Ht 5' 7.5" (1.715 m)   Wt 64 kg   SpO2 99%   BMI 21.76 kg/m     General: Alert, oriented x3, no distress, very lean, appears fit Head: no evidence of trauma, PERRL, EOMI, no exophtalmos or lid lag, no myxedema, no xanthelasma; normal ears, nose and oropharynx Neck: normal jugular venous pulsations and no hepatojugular reflux; brisk carotid pulses without delay and no carotid bruits Chest: clear to auscultation, no signs of consolidation by percussion or palpation, normal fremitus, symmetrical and full respiratory excursions Cardiovascular: normal position and quality of the apical impulse, regular rhythm, normal first and second heart sounds, no murmurs, rubs or gallops Abdomen: no tenderness or distention, no masses by palpation, no abnormal pulsatility or arterial bruits, normal bowel sounds, no hepatosplenomegaly Extremities: no clubbing, cyanosis or edema; 2+ radial, ulnar and brachial pulses bilaterally; 2+ right femoral, posterior tibial and dorsalis pedis pulses; 2+ left femoral, posterior tibial and dorsalis pedis pulses; no subclavian or femoral bruits Neurological: grossly nonfocal Psych: Normal mood and affect   ASSESSMENT & PLAN:    1. Heterozygous familial hypercholesterolemia   2. Essential hypertension   3. Iatrogenic hyperthyroidism       Familial heterozygous hypercholesterolemia:tolerating Repatha + ezetimibe without side effects, in June LDL-C 53 and LDL-P 676.  Continue healthy lifestyle, regular exercise.   HTN: her blood pressure is severely elevated today, but she is under emotional stress. She reports that when she checked in the past blood pressure was always <140/90 at home; it was 136/84 at last PCP visit.  Asked her to monitor home blood pressure and send [place] a log.  On carvedilol.  Discussed risk of rebound HTN with abrupt beta blocker discontinuation. Hyperthyroidism (iatrogenic): dose of levothyroxine was reduced at last office visit and due for labs later this month.      Medication Adjustments/Labs and Tests Ordered: Current medicines are reviewed at length with the patient today.  Concerns regarding medicines are outlined above.   Tests Ordered: No orders of the defined types were placed in this encounter.   Medication Changes: No orders of the defined types were placed in this encounter.   Follow Up:  1 year  Signed, Thurmon Fair, MD  04/06/2022 10:37 AM    [facility]

## 2022-04-06 NOTE — Patient Instructions (Signed)
Medication Instructions:  Your physician recommends that you continue on your current medications as directed. Please refer to the Current Medication list given to you today.  *If you need a refill on your cardiac medications before your next appointment, please call your pharmacy*   Lab Work: None If you have labs (blood work) drawn today and your tests are completely normal, you will receive your results only by: MyChart Message (if you have MyChart) OR A paper copy in the mail If you have any lab test that is abnormal or we need to change your treatment, we will call you to review the results.   Follow-Up: At [facility], you and your health needs are our priority.  As part of our continuing mission to provide you with exceptional heart care, we have created designated Provider Care Teams.  These Care Teams include your primary Cardiologist (physician) and Advanced Practice Providers (APPs -  Physician Assistants and Nurse Practitioners) who all work together to provide you with the care you need, when you need it.  Your next appointment:   1 year(s)  The format for your next appointment:   In Person  Provider:   Dr Royann Shivers  Other Instructions Your physician has requested that you regularly monitor and record your blood pressure readings at home for the next few weeks. Please use the same machine at the same time of day to check your readings, record them and send them to [place] via mychart.   Please monitor blood pressures and keep a log of your readings.    Make sure to check 2 hours after your medications.    AVOID these things for 30 minutes before checking your blood pressure: No Drinking caffeine. No Drinking alcohol. No Eating. No Smoking. No Exercising.   Five minutes before checking your blood pressure: Pee. Sit in a dining chair. Avoid sitting in a soft couch or armchair. Be quiet. Do not talk   Important Information About Sugar

## 2022-04-07 ENCOUNTER — Encounter: Payer: Self-pay | Admitting: Cardiovascular Disease

## 2022-04-08 ENCOUNTER — Telehealth: Payer: Self-pay | Admitting: Pharmacist

## 2022-04-08 MED ORDER — REPATHA SURECLICK 140 MG/ML ~~LOC~~ SOAJ: 1.0000 mL | SUBCUTANEOUS | 11 refills | Status: DC

## 2022-04-08 NOTE — Telephone Encounter (Signed)
Repatha PA submitted, key [id number]. Will send in another rx once approved.

## 2022-04-08 NOTE — Telephone Encounter (Signed)
Pt already has approved PA on file through 04/05/23.

## 2022-05-02 DIAGNOSIS — R569 Unspecified convulsions: Secondary | ICD-10-CM

## 2022-05-02 HISTORY — DX: Unspecified convulsions: R56.9

## 2022-05-04 ENCOUNTER — Emergency Department (HOSPITAL_BASED_OUTPATIENT_CLINIC_OR_DEPARTMENT_OTHER): Payer: BC Managed Care – PPO

## 2022-05-04 ENCOUNTER — Emergency Department (HOSPITAL_BASED_OUTPATIENT_CLINIC_OR_DEPARTMENT_OTHER)
Admission: EM | Admit: 2022-05-04 | Discharge: 2022-05-04 | Disposition: A | Discharge status: Home / Self Care | Payer: BC Managed Care – PPO | Attending: Emergency Medicine | Admitting: Emergency Medicine

## 2022-05-04 ENCOUNTER — Other Ambulatory Visit: Payer: Self-pay

## 2022-05-04 ENCOUNTER — Encounter (HOSPITAL_BASED_OUTPATIENT_CLINIC_OR_DEPARTMENT_OTHER): Payer: Self-pay | Admitting: Emergency Medicine

## 2022-05-04 DIAGNOSIS — E871 Hypo-osmolality and hyponatremia: Secondary | ICD-10-CM | POA: Diagnosis not present

## 2022-05-04 DIAGNOSIS — W19XXXA Unspecified fall, initial encounter: Secondary | ICD-10-CM | POA: Diagnosis not present

## 2022-05-04 DIAGNOSIS — Z79899 Other long term (current) drug therapy: Secondary | ICD-10-CM | POA: Insufficient documentation

## 2022-05-04 DIAGNOSIS — I10 Essential (primary) hypertension: Secondary | ICD-10-CM | POA: Insufficient documentation

## 2022-05-04 DIAGNOSIS — R6889 Other general symptoms and signs: Secondary | ICD-10-CM | POA: Diagnosis not present

## 2022-05-04 DIAGNOSIS — Z1152 Encounter for screening for COVID-19: Secondary | ICD-10-CM | POA: Insufficient documentation

## 2022-05-04 DIAGNOSIS — R569 Unspecified convulsions: Secondary | ICD-10-CM | POA: Insufficient documentation

## 2022-05-04 DIAGNOSIS — Z7982 Long term (current) use of aspirin: Secondary | ICD-10-CM | POA: Insufficient documentation

## 2022-05-04 DIAGNOSIS — E039 Hypothyroidism, unspecified: Secondary | ICD-10-CM | POA: Diagnosis not present

## 2022-05-04 LAB — CBC
HCT: 40.5 % (ref 36.0–46.0)
Hemoglobin: 13.8 g/dL (ref 12.0–15.0)
MCH: 30.9 pg (ref 26.0–34.0)
MCHC: 34.1 g/dL (ref 30.0–36.0)
MCV: 90.8 fL (ref 80.0–100.0)
Platelets: 166 10*3/uL (ref 150–400)
RBC: 4.46 MIL/uL (ref 3.87–5.11)
RDW: 13.2 % (ref 11.5–15.5)
WBC: 7.1 10*3/uL (ref 4.0–10.5)
nRBC: 0 % (ref 0.0–0.2)

## 2022-05-04 LAB — RAPID URINE DRUG SCREEN, HOSP PERFORMED
Amphetamines: NOT DETECTED
Barbiturates: NOT DETECTED
Benzodiazepines: NOT DETECTED
Cocaine: NOT DETECTED
Opiates: NOT DETECTED
Tetrahydrocannabinol: POSITIVE — AB

## 2022-05-04 LAB — URINALYSIS, ROUTINE W REFLEX MICROSCOPIC
Bilirubin Urine: NEGATIVE
Glucose, UA: NEGATIVE mg/dL
Ketones, ur: 40 mg/dL — AB
Leukocytes,Ua: NEGATIVE
Nitrite: NEGATIVE
Protein, ur: NEGATIVE mg/dL
Specific Gravity, Urine: 1.015 (ref 1.005–1.030)
pH: 7 (ref 5.0–8.0)

## 2022-05-04 LAB — RESP PANEL BY RT-PCR (RSV, FLU A&B, COVID)  RVPGX2
Influenza A by PCR: NEGATIVE
Influenza B by PCR: NEGATIVE
Resp Syncytial Virus by PCR: NEGATIVE
SARS Coronavirus 2 by RT PCR: NEGATIVE

## 2022-05-04 LAB — BASIC METABOLIC PANEL
Anion gap: 13 (ref 5–15)
BUN: 7 mg/dL — ABNORMAL LOW (ref 8–23)
CO2: 21 mmol/L — ABNORMAL LOW (ref 22–32)
Calcium: 8.7 mg/dL — ABNORMAL LOW (ref 8.9–10.3)
Chloride: 95 mmol/L — ABNORMAL LOW (ref 98–111)
Creatinine, Ser: 0.55 mg/dL (ref 0.44–1.00)
GFR, Estimated: >60 mL/min (ref >60)
Glucose, Bld: 116 mg/dL — ABNORMAL HIGH (ref 70–99)
Potassium: 3.3 mmol/L — ABNORMAL LOW (ref 3.5–5.1)
Sodium: 129 mmol/L — ABNORMAL LOW (ref 135–145)

## 2022-05-04 LAB — URINALYSIS, MICROSCOPIC (REFLEX)

## 2022-05-04 LAB — CBG MONITORING, ED: Glucose-Capillary: 112 mg/dL — ABNORMAL HIGH (ref 70–99)

## 2022-05-04 MED ORDER — LABETALOL HCL 5 MG/ML IV SOLN
10.0000 mg | Freq: Once | INTRAVENOUS | Status: AC
  Administered 2022-05-04: 10 mg via INTRAVENOUS
  Filled 2022-05-04: qty 4

## 2022-05-04 MED ORDER — SODIUM CHLORIDE 0.9 % IV BOLUS
1000.0000 mL | Freq: Once | INTRAVENOUS | Status: AC
  Administered 2022-05-04: 1000 mL via INTRAVENOUS

## 2022-05-04 NOTE — ED Notes (Signed)
Pt ambulatory to bathroom, standby assistance.  

## 2022-05-04 NOTE — ED Provider Notes (Signed)
[facility] EMERGENCY DEPARTMENT Provider Note   CSN: [id number] Arrival date & time: 05/04/22  1702     History  Chief Complaint  Patient presents with   Seizures    Kathleen [name] is a 67 y.o. female with history of hypertension, hyperlipidemia, hypothyroidism, depression, marijuana use, bipolar disorder who presents the emergency with husband after seizure-like episode occurring earlier this morning.  Patient states that she has not been feeling well for the past several days, with flulike symptoms including fever, body aches, nasal congestion.  Husband reports that 1 AM this morning that he witnessed her having a full body jerking and spitting.  This lasted about a minute.  Afterwards she got up and continued walking to and from the bathroom, and seemed very agitated.  Has been Trying to talk to her, but she was not responding.  All of a sudden she looked at him and asked what happened.  He tried to explain, and she said that she did not remember this.  She then rolled over and fell back asleep.  He tried to wake her up to call an ambulance, and she stated that she felt fine and adamantly refused this.  No trauma noted from seizure, no incontinence reported.   Patient currently on several psychiatric medications.  States that she has not taken Xanax in several days.  Has been taking all her other medications as prescribed.   Seizures      Home Medications Prior to Admission medications   Medication Sig Start Date End Date Taking? Authorizing Provider  ALPRAZolam Duanne Moron) 0.5 MG tablet Take 0.25-0.5 mg by mouth daily as needed. Patient not taking: Reported on 04/06/2022 10/22/21   [provider]  aspirin EC 81 MG tablet Take 81 mg by mouth daily.    [provider]  carvedilol (COREG) 6.25 MG tablet Take 1 tablet (6.25 mg total) by mouth 2 (two) times daily with a meal. 12/02/19   Little Ishikawa, MD  Cholecalciferol (VITAMIN D3) 5000 UNITS CAPS Take  5,000 Units by mouth daily. Reported on 09/24/2015    [provider]  Evolocumab (REPATHA SURECLICK) 323 MG/ML SOAJ Inject 140 mg into the skin every 14 (fourteen) days. 04/08/22   Croitoru, Mihai, MD  ezetimibe (ZETIA) 10 MG tablet Take 10 mg by mouth daily. 10/23/19   [provider]  haloperidol (HALDOL) 5 MG tablet Take 1 tablet (5 mg total) by mouth every 8 (eight) hours as needed for up to 10 days for agitation. 12/02/19 12/12/19  Little Ishikawa, MD  levothyroxine (SYNTHROID) 100 MCG tablet Take 100 mcg by mouth daily. 03/19/22   [provider]  Multiple Vitamin (MULTIVITAMIN WITH MINERALS) TABS tablet Take 1 tablet by mouth daily. 12/02/19   Little Ishikawa, MD  QUEtiapine (SEROQUEL) 100 MG tablet Take 1 tablet (100 mg total) by mouth 2 (two) times daily. 12/02/19   Little Ishikawa, MD  sertraline (ZOLOFT) 100 MG tablet Take 200 mg by mouth daily.     [provider]  Zinc 50 MG TABS Take 1 tablet by mouth daily.    [provider]      Allergies    Clindamycin/lincomycin    Review of Systems   Review of Systems  Constitutional:  Positive for fever.  HENT:  Positive for congestion.   Respiratory:  Positive for cough.   Musculoskeletal:  Positive for myalgias.  Neurological:  Positive for seizures and headaches.    Physical Exam Updated Vital Signs  BP (!) 190/78   Pulse 68   Temp 99 F (37.2 C)   Resp 20   Ht 5' 7.5" (1.715 m)   Wt 68 kg   SpO2 96%   BMI 23.15 kg/m  Physical Exam Vitals and nursing note reviewed.  Constitutional:      Appearance: Normal appearance.     Comments: Appears restless  HENT:     Head: Normocephalic and atraumatic.  Eyes:     Conjunctiva/sclera: Conjunctivae normal.  Cardiovascular:     Rate and Rhythm: Normal rate and regular rhythm.  Pulmonary:     Effort: Pulmonary effort is normal. No respiratory distress.     Breath sounds: Normal breath sounds.  Abdominal:     General: There  is no distension.     Palpations: Abdomen is soft.     Tenderness: There is no abdominal tenderness.  Skin:    General: Skin is warm and dry.  Neurological:     General: No focal deficit present.     Mental Status: She is alert.     Comments: Neuro: Speech is clear, able to follow commands. CN III-XII intact grossly intact. PERRLA. EOMI. Sensation intact throughout. Str 5/5 all extremities.     ED Results / Procedures / Treatments   Labs (all labs ordered are listed, but only abnormal results are displayed) Labs Reviewed  BASIC METABOLIC PANEL - Abnormal; Notable for the following components:      Result Value   Sodium 129 (*)    Potassium 3.3 (*)    Chloride 95 (*)    CO2 21 (*)    Glucose, Bld 116 (*)    BUN 7 (*)    Calcium 8.7 (*)    All other components within normal limits  URINALYSIS, ROUTINE W REFLEX MICROSCOPIC - Abnormal; Notable for the following components:   Hgb urine dipstick SMALL (*)    Ketones, ur 40 (*)    All other components within normal limits  RAPID URINE DRUG SCREEN, HOSP PERFORMED - Abnormal; Notable for the following components:   Tetrahydrocannabinol POSITIVE (*)    All other components within normal limits  URINALYSIS, MICROSCOPIC (REFLEX) - Abnormal; Notable for the following components:   Bacteria, UA RARE (*)    All other components within normal limits  CBG MONITORING, ED - Abnormal; Notable for the following components:   Glucose-Capillary 112 (*)    All other components within normal limits  RESP PANEL BY RT-PCR (RSV, FLU A&B, COVID)  RVPGX2  CBC    EKG EKG Interpretation  Date/Time:  Wednesday May 04 2022 17:31:51 EST Ventricular Rate:  67 PR Interval:  159 QRS Duration: 94 QT Interval:  404 QTC Calculation: 427 R Axis:   88 Text Interpretation: Sinus rhythm Probable left atrial enlargement Borderline right axis deviation No significant change since last tracing Confirmed by Melene Plan [id number]) on 05/04/2022 6:06:19  PM  Radiology CT Head Wo Contrast  Result Date: 05/04/2022 CLINICAL DATA:  Seizure, new-onset, no history of trauma EXAM: CT HEAD WITHOUT CONTRAST TECHNIQUE: Contiguous axial images were obtained from the base of the skull through the vertex without intravenous contrast. RADIATION DOSE REDUCTION: This exam was performed according to the departmental dose-optimization program which includes automated exposure control, adjustment of the mA and/or kV according to patient size and/or use of iterative reconstruction technique. COMPARISON:  MRI head 11/30/2019 FINDINGS: Brain: No evidence of large-territorial acute infarction. No parenchymal hemorrhage. No mass lesion. No extra-axial collection. No mass effect or midline  shift. No hydrocephalus. Basilar cisterns are patent. Vascular: No hyperdense vessel. Skull: No acute fracture or focal lesion. Sinuses/Orbits: Paranasal sinuses and mastoid air cells are clear. The orbits are unremarkable. Other: None. IMPRESSION: No acute intracranial abnormality. Electronically Signed   By: Tish Frederickson M.D.   On: 05/04/2022 19:09    Procedures Procedures    Medications Ordered in ED Medications  labetalol (NORMODYNE) injection 10 mg (10 mg Intravenous Given 05/04/22 1818)  sodium chloride 0.9 % bolus 1,000 mL (0 mLs Intravenous Stopped 05/04/22 2041)    ED Course/ Medical Decision Making/ A&P                           Medical Decision Making Amount and/or Complexity of Data Reviewed Labs: ordered. Radiology: ordered.  Risk Prescription drug management.  This patient is a 67 y.o. female  who presents to the ED for concern of seizure like activity.   Differential diagnoses prior to evaluation: The emergent differential diagnosis includes, but is not limited to,  Epilepsy, pseudoseizure, syncopal episode with seizure-like movements, electrolyte abnormalities, ETOH withdrawal, head trauma, sepsis, space occupying lesion. This is not an exhaustive differential.    Past Medical History / Co-morbidities: hypertension, hyperlipidemia, hypothyroidism, depression, marijuana use, bipolar disorder  Additional history: Chart reviewed. Pertinent results include: Patient hospitalized in July 2021 for metabolic encephalopathy, discharged to inpatient psych for bipolar disorder.  Physical Exam: Physical exam performed. The pertinent findings include: Initially hypertensive to 208/110, on reevaluation lowered to 145/91.  Patient appears restless, but in no acute distress.  Normal neurologic exam as above.  Lab Tests/Imaging studies: I personally interpreted labs/imaging and the pertinent results include: Sodium 129, potassium 3.3, chloride 96, glucose 116.  No anion gap.  CBC unremarkable.  Respiratory panel negative for COVID, flu, RSV.  CT head without acute abnormalities. I agree with the radiologist interpretation.  Cardiac monitoring: EKG obtained and interpreted by my attending physician which shows: sinus rhythm   Medications: I ordered medication including IV fluids and labetalol.  I have reviewed the patients home medicines and have made adjustments as needed.  Consultations obtained: I consulted with neurologist Dr. Derry Lory who reviewed patient's case and recommended outpatient follow up with neurology and order for outpatient EEG.   Disposition: After consideration of the diagnostic results and the patients response to treatment, I feel that emergency department workup does not suggest an emergent condition requiring admission or immediate intervention beyond what has been performed at this time. The plan is: discharge to home with seizure precautions and neurology follow up. No emergent etiology found for patient's symptoms today. Had thorough discussion about marijuana cessation as it could exacerbate/trigger seizures. Patient endorses understanding and says she knows she needs to quit. Patient's blood pressure continued to be fairly labile in  the ER, recommended following up with PCP for ongoing management. Low concern for hypertensive urgency or emergency. The patient is safe for discharge and has been instructed to return immediately for worsening symptoms, change in symptoms or any other concerns.  Final Clinical Impression(s) / ED Diagnoses Final diagnoses:  Seizure-like activity (HCC)  Hyponatremia  Flu-like symptoms    Rx / DC Orders ED Discharge Orders          Ordered    EEG adult        05/04/22 2211    Ambulatory referral to Neurology       Comments: An appointment is requested in approximately: 2 weeks  05/04/22 2211           Portions of this report may have been transcribed using voice recognition software. Every effort was made to ensure accuracy; however, inadvertent computerized transcription errors may be present.    Estill Cotta 05/04/22 [place], [place], DO 05/04/22 2259

## 2022-05-04 NOTE — ED Notes (Signed)
Written and verbal inst to pt and her spouse  Verbalized an understanding  To home with family

## 2022-05-04 NOTE — ED Notes (Signed)
ED Provider at bedside. 

## 2022-05-04 NOTE — Discharge Instructions (Signed)
You were seen in the emergency department today for seizure-like activity.  As we discussed we ruled out the emergent causes of seizures.  While your sodium level was slightly low, otherwise your lab work was fairly unremarkable.  The CT of your head also did not show anything significant.  It is incredibly important that you follow-up with a neurologist.  The neurologist I discussed your case with was Dr Lorrin Goodell and he recommended we order you an outpatient EEG.  This will help the neurologist determine the location of the seizure-like activity, and how best to treat that.  I have sent a referral to [facility] neurology.  Also attached their contact information.  Please call them on Monday if you have not yet heard from them.  Per [facility] statutes, patients with seizures are not allowed to drive until  they have been seizure-free for six months. Use caution when using heavy equipment or power tools. Avoid working on ladders or at heights. Take showers instead of baths. Ensure the water temperature is not too high on the home water heater. Do not go swimming alone. When caring for infants or small children, sit down when holding, feeding, or changing them to minimize risk of injury to the child in the event you have a seizure.  Also, maintain good sleep hygiene. Avoid alcohol or other drugs.   Continue to monitor how you're doing and return to the ER for new or worsening symptoms.

## 2022-05-04 NOTE — ED Notes (Signed)
Pt ambulatory from triage, unassisted.

## 2022-05-04 NOTE — ED Triage Notes (Addendum)
Pt reports cough/congestion, flu-like sxs; per husband, pt had seizure-like activity around 0100; denies seizure hx; hypertensive in triage and pt noted to be restless; c/o CP and abd pain

## 2022-05-05 ENCOUNTER — Encounter: Payer: Self-pay | Admitting: Neurology

## 2022-05-10 ENCOUNTER — Ambulatory Visit: Payer: BC Managed Care – PPO | Admitting: Neurology

## 2022-05-11 DIAGNOSIS — R350 Frequency of micturition: Secondary | ICD-10-CM | POA: Diagnosis not present

## 2022-05-16 ENCOUNTER — Ambulatory Visit: Payer: BC Managed Care – PPO | Admitting: Neurology

## 2022-05-16 ENCOUNTER — Encounter: Payer: Self-pay | Admitting: Neurology

## 2022-05-16 VITALS — BP 224/112 | HR 61 | Ht 68.5 in | Wt 135.0 lb

## 2022-05-16 DIAGNOSIS — R569 Unspecified convulsions: Secondary | ICD-10-CM | POA: Diagnosis not present

## 2022-05-16 DIAGNOSIS — E039 Hypothyroidism, unspecified: Secondary | ICD-10-CM | POA: Diagnosis not present

## 2022-05-16 DIAGNOSIS — E785 Hyperlipidemia, unspecified: Secondary | ICD-10-CM | POA: Diagnosis not present

## 2022-05-16 NOTE — Progress Notes (Addendum)
NEUROLOGY CONSULTATION NOTE  Melainie Krinsky MRN: [id number] DOB: [birth year]  Referring provider: Margaretmary Lombard, PA-C Primary care provider: Dr. Derinda Late  Reason for consult:  new onset seizure   Thank you for your kind referral of Hanna Ra for consultation of the above symptoms. Although her history is well known to you, please allow me to reiterate it for the purpose of our medical record. The patient was accompanied to the clinic by her husband who also provides collateral information. Records and images were personally reviewed where available.   HISTORY OF PRESENT ILLNESS: This is a pleasant 67 year old right-handed woman with a history of hypertension, hyperlipidemia, hypothyroidism, presenting for evaluation of new onset seizure that occurred on 05/04/2022. She had been sick with an upper respiratory infection for 3 days and recalls having a temperature of 101 when she went to bed. At around 1pm, her husband felt the bed shaking and woke up to find her with arms and legs extended and stiff, head bent to her chest with eyes open and some spittle around her mouth. The shaking lasted for around a minute, then she got up and walked to the bathroom, turned around and went back to bed, then did this exact pattern for 5-6 times. At first she was not answering him then she slowly started answering saying she was nauseated. After several more minutes, she said she felt fine and went back to sleep. She woke up that morning still feeling sick with back pain. They called her PCP and were instructed to go to the ER. Bloodwork showed a Na of 129, UDS positive for THC. Head CT without contrast no acute changes. EKG showed normal sinus rhythm, probable left atrial enlargement. No further similar episodes since then. No prior history of seizures. Her husband denies any staring/unresponsive episodes, she denies any other gaps in time, olfactory/gustatory hallucinations, focal  numbness/tingling/weakness, myoclonic jerks. No headaches, dizziness, vision changes, neck pain, no further back pain, bowel/bladder dysfunction. She denies any sleep deprivation. She drinks alcohol occasionally and did not have any the nights prior. She has prn Xanax and prior to the seizure had hardly been taking it. Since then, she has been taking it at night to help with sleep. She smokes pot daily. When asked about memory, she states "I don't know." She had a normal birth and early development.  There is no history of febrile convulsions, CNS infections such as meningitis/encephalitis, significant traumatic brain injury, neurosurgical procedures, or family history of seizures.  She had a brain MRI without contrast in 10/2019 for acute changes in behavior secondary to Bipolar 1 disorder. Per notes, she was paranoid, with word salad and pressured nonsensical speech. No recurrence of symptoms.   PAST MEDICAL HISTORY: Past Medical History:  Diagnosis Date   Clostridium difficile infection    Hyperlipidemia    Thyroid disease     PAST SURGICAL HISTORY: Past Surgical History:  Procedure Laterality Date   ANKLE FRACTURE SURGERY     CERVICAL CONE BIOPSY      MEDICATIONS: Current Outpatient Medications on File Prior to Visit  Medication Sig Dispense Refill   ALPRAZolam (XANAX) 0.5 MG tablet Take 0.25-0.5 mg by mouth daily as needed.     carvedilol (COREG) 6.25 MG tablet Take 1 tablet (6.25 mg total) by mouth 2 (two) times daily with a meal. 60 tablet 0   Evolocumab (REPATHA SURECLICK) 841 MG/ML SOAJ Inject 140 mg into the skin every 14 (fourteen) days. 2 mL 11   ezetimibe (  ZETIA) 10 MG tablet Take 10 mg by mouth daily.     levothyroxine (SYNTHROID) 100 MCG tablet Take 100 mcg by mouth daily.     No current facility-administered medications on file prior to visit.    ALLERGIES: Allergies  Allergen Reactions   Clindamycin/Lincomycin Swelling    Gi problems and made throat close up after  7 days    FAMILY HISTORY: Family History  Problem Relation Age of Onset   Heart disease Father    Deep vein thrombosis Sister     SOCIAL HISTORY: Social History   Socioeconomic History   Marital status: Married    Spouse name: Not on file   Number of children: Not on file   Years of education: Not on file   Highest education level: Not on file  Occupational History   Not on file  Tobacco Use   Smoking status: Never   Smokeless tobacco: Never  Vaping Use   Vaping Use: Never used  Substance and Sexual Activity   Alcohol use: Yes    Comment: occ   Drug use: Yes    Types: Marijuana   Sexual activity: Not on file  Other Topics Concern   Not on file  Social History Narrative   Are you right handed or left handed? Right handed    Are you currently employed ? 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 na   Do you live at home alone? No    Who lives with you? Family    What type of home do you live in: 1 story or 2 story? 2 story no many steps 15 steps       Social Determinants of Health   Financial Resource Strain: Not on file  Food Insecurity: Not on file  Transportation Needs: Not on file  Physical Activity: Not on file  Stress: Not on file  Social Connections: Not on file  Intimate Partner Violence: Not on file     PHYSICAL EXAM: Vitals:   05/16/22 1231 05/16/22 1246  BP: (!) 212/101 (!) 200/100  Pulse: 61   SpO2: 97%    General: No acute distress Head:  Normocephalic/atraumatic Skin/Extremities: No rash, no edema Neurological Exam: Mental status: alert and oriented to person, place, and time, no dysarthria or aphasia, Fund of knowledge is appropriate.  Recent and remote memory are intact, 3/3 delayed recall.  Attention and concentration are normal, 5/5 WORLD backwards Cranial nerves: CN I: not tested CN II: pupils equal, round, visual fields intact CN III, IV, VI:  full range of motion, no nystagmus, no ptosis CN V: facial sensation intact CN VII: upper  and lower face symmetric CN VIII: hearing intact to conversation Bulk & Tone: normal, no fasciculations. Motor: 5/5 throughout with no pronator drift. Sensation: intact to light touch, cold, pin, vibration sense.  No extinction to double simultaneous stimulation.  Romberg test negative Deep Tendon Reflexes: +2 throughout Cerebellar: no incoordination on finger to nose testing Gait: narrow-based and steady, able to tandem walk adequately. Tremor: none   IMPRESSION: This is a pleasant 68 year old right-handed woman with a history of hypertension, hyperlipidemia, hypothyroidism, presenting for evaluation of new onset nocturnal seizure that occurred on 05/04/2022. Etiology unclear, neurological exam normal, no clear epilepsy risk factors. We discussed doing a brain MRI with and without contrast and 1-hour EEG to assess for focal abnormalities that increase risk for recurrent seizure. Her sodium level was 129, however this may be baseline (130 in 12/2019). Monitor sodium  level with PCP. She had not been taking Xanax on a regular basis prior to the seizure. We discussed that after an initial seizure, unless there are significant risk factors, an abnormal neurological exam, an EEG showing epileptiform abnormalities, and/or abnormal neuroimaging, treatment with an antiepileptic drug is not indicated. We discussed 10% of the population may have a single seizure. Patients with a single unprovoked seizure have a recurrence rate of 33% after a single seizure and 73% after a second seizure. We discussed [place] driving restrictions which indicate a patient needs to free of seizures or events of altered awareness for 6 months prior to resuming driving. The patient agreed to comply with these restrictions. Seizure precautions discussed. BP significantly elevated, she reports white coat hypertension, continue follow-up with PCP and Cardiology. Follow-up in 3 months, call for any changes.    Thank you for allowing me to  participate in the care of this patient. Please do not hesitate to call for any questions or concerns.   Patrcia Dolly, M.D.  CC: Lorin Roemhildt, PA-C, Dr. Duaine Dredge

## 2022-05-16 NOTE — Patient Instructions (Signed)
Good to meet you.  Schedule MRI brain with and without contrast  2. Schedule 1-hour EEG  3. Follow-up in 3 months, call for any changes   Seizure Precautions: 1. If medication has been prescribed for you to prevent seizures, take it exactly as directed.  Do not stop taking the medicine without talking to your doctor first, even if you have not had a seizure in a long time.   2. Avoid activities in which a seizure would cause danger to yourself or to others.  Don't operate dangerous machinery, swim alone, or climb in high or dangerous places, such as on ladders, roofs, or girders.  Do not drive unless your doctor says you may.  3. If you have any warning that you may have a seizure, lay down in a safe place where you can't hurt yourself.    4.  No driving for 6 months from last seizure, as per [facility].   Please refer to the following link on the [employer] website for more information: http://www.epilepsyfoundation.org/answerplace/Social/driving/drivingu.cfm   5.  Maintain good sleep hygiene. Avoid alcohol.  6.  Contact your doctor if you have any problems that may be related to the medicine you are taking.  7.  Call 911 and bring the patient back to the ED if:        A.  The seizure lasts longer than 5 minutes.       B.  The patient doesn't awaken shortly after the seizure  C.  The patient has new problems such as difficulty seeing, speaking or moving  D.  The patient was injured during the seizure  E.  The patient has a temperature over 102 F (39C)  F.  The patient vomited and now is having trouble breathing

## 2022-05-18 ENCOUNTER — Other Ambulatory Visit: Payer: BC Managed Care – PPO

## 2022-05-24 ENCOUNTER — Ambulatory Visit: Payer: BC Managed Care – PPO | Admitting: Neurology

## 2022-05-24 DIAGNOSIS — R569 Unspecified convulsions: Secondary | ICD-10-CM | POA: Diagnosis not present

## 2022-05-24 NOTE — Progress Notes (Signed)
EEG complete - results pending 

## 2022-05-27 ENCOUNTER — Encounter: Payer: Self-pay | Admitting: Neurology

## 2022-05-30 NOTE — Procedures (Signed)
ELECTROENCEPHALOGRAM REPORT  Date of Study: 05/24/2022  Patient's Name: Kathleen [name] MRN: [id number] Date of Birth: 1955/06/11  Referring Provider: Dr. Ellouise Newer  Clinical History: This is a 66 year old woman with new onset nocturnal. EEG for classification.  Medications: Synthroid, Coreg, Repatha, Xanax  Technical Summary: A multichannel digital 1-hour EEG recording measured by the international 10-20 system with electrodes applied with paste and impedances below 5000 ohms performed in our laboratory with EKG monitoring in an awake and drowsy patient.  Hyperventilation and photic stimulation were performed.  The digital EEG was referentially recorded, reformatted, and digitally filtered in a variety of bipolar and referential montages for optimal display.    Description: The patient is awake and drowsy during the recording.  During maximal wakefulness, there is a symmetric, medium voltage 9-10 Hz posterior dominant rhythm that attenuates with eye opening.  The record is symmetric.  During drowsiness, there is an increase in theta slowing of the background, with shifting asymmetry over the bilateral temporal regions. Sleep was not captured. Hyperventilation and photic stimulation did not elicit any abnormalities.  There were no epileptiform discharges or electrographic seizures seen.    EKG lead was unremarkable.  Impression: This 1-hour awake and drowsy EEG is within normal limits.  Clinical Correlation: A normal EEG does not exclude a clinical diagnosis of epilepsy.  If further clinical questions remain, prolonged EEG may be helpful.  Clinical correlation is advised.   Ellouise Newer, M.D.

## 2022-06-08 ENCOUNTER — Telehealth: Payer: Self-pay

## 2022-06-08 NOTE — Telephone Encounter (Signed)
-----   Message from Cameron Sprang, MD sent at 06/08/2022  9:52 AM EST ----- Pls let her know the EEG is normal. Proceed with brain MRI as scheduled, thanks

## 2022-06-08 NOTE — Telephone Encounter (Signed)
Pt called an informed that the EEG is normal. Proceed with brain MRI as scheduled

## 2022-06-14 ENCOUNTER — Ambulatory Visit
Admission: RE | Admit: 2022-06-14 | Discharge: 2022-06-14 | Disposition: A | Discharge status: Home / Self Care | Payer: BC Managed Care – PPO | Source: Ambulatory Visit | Attending: Neurology | Admitting: Neurology

## 2022-06-14 DIAGNOSIS — R569 Unspecified convulsions: Secondary | ICD-10-CM

## 2022-06-14 MED ORDER — GADOPICLENOL 0.5 MMOL/ML IV SOLN
6.0000 mL | Freq: Once | INTRAVENOUS | Status: AC | PRN
  Administered 2022-06-14: 6 mL via INTRAVENOUS

## 2022-06-16 ENCOUNTER — Telehealth: Payer: Self-pay

## 2022-06-16 NOTE — Telephone Encounter (Signed)
Pt called an informed that brain MRI looks good, no tumor, stroke, or bleed

## 2022-06-16 NOTE — Telephone Encounter (Signed)
-----   Message from Cameron Sprang, MD sent at 06/15/2022  3:43 PM EST ----- Pls let her know brain MRI looks good, no tumor, stroke, or bleed, thanks

## 2022-07-11 DIAGNOSIS — E039 Hypothyroidism, unspecified: Secondary | ICD-10-CM | POA: Diagnosis not present

## 2022-08-16 ENCOUNTER — Ambulatory Visit: Payer: BC Managed Care – PPO | Admitting: Neurology

## 2022-12-09 ENCOUNTER — Other Ambulatory Visit: Payer: Self-pay | Admitting: Pharmacist

## 2022-12-09 MED ORDER — REPATHA SURECLICK 140 MG/ML ~~LOC~~ SOAJ: 1.0000 mL | SUBCUTANEOUS | 11 refills | Status: DC

## 2022-12-13 ENCOUNTER — Encounter: Payer: Self-pay | Admitting: Gastroenterology

## 2023-01-12 DIAGNOSIS — E871 Hypo-osmolality and hyponatremia: Secondary | ICD-10-CM | POA: Diagnosis not present

## 2023-01-16 ENCOUNTER — Ambulatory Visit: Payer: BC Managed Care – PPO

## 2023-01-16 VITALS — Ht 68.5 in | Wt 125.0 lb

## 2023-01-16 DIAGNOSIS — Z1211 Encounter for screening for malignant neoplasm of colon: Secondary | ICD-10-CM

## 2023-01-16 MED ORDER — NA SULFATE-K SULFATE-MG SULF 17.5-3.13-1.6 GM/177ML PO SOLN: 1.0000 | Freq: Once | ORAL | 0 refills | Status: AC

## 2023-01-16 NOTE — Progress Notes (Signed)
No egg or soy allergy known to patient  No issues known to pt with past sedation with any surgeries or procedures Patient denies ever being told they had issues or difficulty with intubation  No FH of Malignant Hyperthermia Pt is not on diet pills Pt is not on  home 02  Pt is not on blood thinners  Pt denies issues with constipation  No A fib or A flutter Have any cardiac testing pending-- no  LOA: independent  Prep: suprep  Patient's chart reviewed by Cathlyn Parsons CNRA prior to previsit and patient appropriate for the [facility].  Previsit completed and red dot placed by patient's name on their procedure day (on provider's schedule).     PV competed with patient. Prep instructions sent via mychart and home address. Goodrx coupon for provided to use for price reduction if needed.

## 2023-01-17 DIAGNOSIS — E039 Hypothyroidism, unspecified: Secondary | ICD-10-CM | POA: Diagnosis not present

## 2023-01-17 DIAGNOSIS — R569 Unspecified convulsions: Secondary | ICD-10-CM | POA: Diagnosis not present

## 2023-01-17 DIAGNOSIS — I6522 Occlusion and stenosis of left carotid artery: Secondary | ICD-10-CM | POA: Diagnosis not present

## 2023-01-17 DIAGNOSIS — R634 Abnormal weight loss: Secondary | ICD-10-CM | POA: Diagnosis not present

## 2023-01-17 DIAGNOSIS — Z0000 Encounter for general adult medical examination without abnormal findings: Secondary | ICD-10-CM | POA: Diagnosis not present

## 2023-01-31 ENCOUNTER — Encounter: Payer: Self-pay | Admitting: Gastroenterology

## 2023-02-07 DIAGNOSIS — E039 Hypothyroidism, unspecified: Secondary | ICD-10-CM | POA: Diagnosis not present

## 2023-02-07 DIAGNOSIS — E871 Hypo-osmolality and hyponatremia: Secondary | ICD-10-CM | POA: Diagnosis not present

## 2023-02-15 ENCOUNTER — Encounter: Payer: Self-pay | Admitting: Gastroenterology

## 2023-02-15 ENCOUNTER — Ambulatory Visit: Payer: BC Managed Care – PPO | Admitting: Gastroenterology

## 2023-02-15 VITALS — BP 158/87 | HR 52 | Temp 97.2°F | Resp 13 | Ht 68.0 in | Wt 125.0 lb

## 2023-02-15 DIAGNOSIS — D123 Benign neoplasm of transverse colon: Secondary | ICD-10-CM

## 2023-02-15 DIAGNOSIS — K635 Polyp of colon: Secondary | ICD-10-CM | POA: Diagnosis not present

## 2023-02-15 DIAGNOSIS — D125 Benign neoplasm of sigmoid colon: Secondary | ICD-10-CM | POA: Diagnosis not present

## 2023-02-15 DIAGNOSIS — Z1211 Encounter for screening for malignant neoplasm of colon: Secondary | ICD-10-CM

## 2023-02-15 MED ORDER — SODIUM CHLORIDE 0.9 % IV SOLN: 500.0000 mL | Freq: Once | INTRAVENOUS | Status: DC

## 2023-02-15 NOTE — Progress Notes (Signed)
Pt's states no medical or surgical changes since previsit or office visit.   Pt reports that she smoked a few puffs of marijuana on yesterday, 02/14/23 around 2:00 pm.  Informed Tim Blocker, CRNA to make aware.  CRNA ok to proceed with procedure.

## 2023-02-15 NOTE — Patient Instructions (Addendum)
Repeat colonoscopy, likely 3 years, after studies                            are complete for surveillance based on pathology                            results.                           - Patient has a contact number available for                            emergencies. The signs and symptoms of potential                            delayed complications were discussed with the                            patient. Return to normal activities tomorrow.                            Written discharge instructions were provided to the                            patient.                           - Resume previous diet with high fiber added.                           - Continue present medications.                           - Await pathology results.  Handout on polyps and diverticulosis given.     YOU HAD AN ENDOSCOPIC PROCEDURE TODAY AT THE [facility]:   Refer to the procedure report that was given to you for any specific questions about what was found during the examination.  If the procedure report does not answer your questions, please call your gastroenterologist to clarify.  If you requested that your care partner not be given the details of your procedure findings, then the procedure report has been included in a sealed envelope for you to review at your convenience later.  YOU SHOULD EXPECT: Some feelings of bloating in the abdomen. Passage of more gas than usual.  Walking can help get rid of the air that was put into your GI tract during the procedure and reduce the bloating. If you had a lower endoscopy (such as a colonoscopy or flexible sigmoidoscopy) you may notice spotting of blood in your stool or on the toilet paper. If you underwent a bowel prep for your procedure, you may not have a normal bowel movement for a few days.  Please Note:  You might notice some irritation and congestion in your nose or some drainage.  This is from the oxygen used during your procedure.   There is no need for concern and it should clear up in a day or so.  SYMPTOMS TO REPORT IMMEDIATELY:  Following lower endoscopy (colonoscopy or  flexible sigmoidoscopy):  Excessive amounts of blood in the stool  Significant tenderness or worsening of abdominal pains  Swelling of the abdomen that is new, acute  Fever of 100F or higher   For urgent or emergent issues, a gastroenterologist can be reached at any hour by calling [phone]. Do not use MyChart messaging for urgent concerns.    DIET:  We do recommend a small meal at first, but then you may proceed to your regular diet.  Drink plenty of fluids but you should avoid alcoholic beverages for 24 hours.  ACTIVITY:  You should plan to take it easy for the rest of today and you should NOT DRIVE or use heavy machinery until tomorrow (because of the sedation medicines used during the test).    FOLLOW UP: Our staff will call the number listed on your records the next business day following your procedure.  We will call around 7:15- 8:00 am to check on you and address any questions or concerns that you may have regarding the information given to you following your procedure. If we do not reach you, we will leave a message.     If any biopsies were taken you will be contacted by phone or by letter within the next 1-3 weeks.  Please call us at [phone] if you have not heard about the biopsies in 3 weeks.    SIGNATURES/CONFIDENTIALITY: You and/or your care partner have signed paperwork which will be entered into your electronic medical record.  These signatures attest to the fact that that the information above on your After Visit Summary has been reviewed and is understood.  Full responsibility of the confidentiality of this discharge information lies with you and/or your care-partner.

## 2023-02-15 NOTE — Progress Notes (Signed)
Sedate, gd SR, tolerated procedure well, VSS, report to RN 

## 2023-02-15 NOTE — Progress Notes (Signed)
Called to room to assist during endoscopic procedure.  Patient ID and intended procedure confirmed with present staff. Received instructions for my participation in the procedure from the performing physician.  

## 2023-02-15 NOTE — Op Note (Signed)
[facility] Patient Name: Kathleen [name] Procedure Date: 02/15/2023 10:36 AM MRN: [id number] Endoscopist: Meryl Dare , MD, [phone] Age: 67 Referring MD:  Date of Birth: 11/06/1955 Gender: Female Account #: [id number] Procedure:                Colonoscopy Indications:              Screening for colorectal malignant neoplasm Medicines:                Monitored Anesthesia Care Procedure:                Pre-Anesthesia Assessment:                           - Prior to the procedure, a History and Physical                            was performed, and patient medications and                            allergies were reviewed. The patient's tolerance of                            previous anesthesia was also reviewed. The risks                            and benefits of the procedure and the sedation                            options and risks were discussed with the patient.                            All questions were answered, and informed consent                            was obtained. Prior Anticoagulants: The patient has                            taken no anticoagulant or antiplatelet agents. ASA                            Grade Assessment: II - A patient with mild systemic                            disease. After reviewing the risks and benefits,                            the patient was deemed in satisfactory condition to                            undergo the procedure.                           After obtaining informed consent, the colonoscope  was passed under direct vision. Throughout the                            procedure, the patient's blood pressure, pulse, and                            oxygen saturations were monitored continuously. The                            Olympus Scope SN: [id number] was introduced through                            the anus and advanced to the the cecum, identified                            by  appendiceal orifice and ileocecal valve. The                            ileocecal valve, appendiceal orifice, and rectum                            were photographed. The quality of the bowel                            preparation was good. The colonoscopy was performed                            without difficulty. The patient tolerated the                            procedure well. Scope In: 10:47:26 AM Scope Out: 11:11:18 AM Scope Withdrawal Time: 0 hours 21 minutes 43 seconds  Total Procedure Duration: 0 hours 23 minutes 52 seconds  Findings:                 The perianal and digital rectal examinations were                            normal.                           A 12 mm polyp was found in the transverse colon.                            The polyp was sessile. The polyp was removed with a                            piecemeal technique using a cold snare. Resection                            and retrieval were complete.                           Three sessile polyps were found in the sigmoid  colon. The polyps were 5 to 7 mm in size. These                            polyps were removed with a cold snare. Resection                            and retrieval were complete.                           A few small-mouthed diverticula were found in the                            left colon. There was no evidence of diverticular                            bleeding.                           The exam was otherwise without abnormality on                            direct and retroflexion views. Complications:            No immediate complications. Estimated blood loss:                            None. Estimated Blood Loss:     Estimated blood loss: none. Impression:               - One 12 mm polyp in the transverse colon, removed                            piecemeal using a cold snare. Resected and                            retrieved.                           -  Three 5 to 7 mm polyps in the sigmoid colon,                            removed with a cold snare. Resected and retrieved.                           - Mild diverticulosis in the left colon.                           - The examination was otherwise normal on direct                            and retroflexion views. Recommendation:           - Repeat colonoscopy, likely 3 years, after studies                            are complete  for surveillance based on pathology                            results.                           - Patient has a contact number available for                            emergencies. The signs and symptoms of potential                            delayed complications were discussed with the                            patient. Return to normal activities tomorrow.                            Written discharge instructions were provided to the                            patient.                           - Resume previous diet with high fiber added.                           - Continue present medications.                           - Await pathology results. Meryl Dare, MD 02/15/2023 11:16:20 AM This report has been signed electronically.

## 2023-02-15 NOTE — Progress Notes (Signed)
History & Physical  Primary Care Physician:  Mosetta Putt, MD Primary Gastroenterologist: Claudette Head, MD  Impression / Plan:  CRC screening, average risk, for colonoscopy.    CHIEF COMPLAINT:  CRC screening  HPI: Kathleen [name] is a 67 y.o. female CRC screening, average risk here for colonoscopy.    Past Medical History:  Diagnosis Date   Clostridium difficile infection    Hyperlipidemia    Hypertension    Seizures (HCC) 05/2022   Thyroid disease     Past Surgical History:  Procedure Laterality Date   ANKLE FRACTURE SURGERY     CERVICAL CONE BIOPSY      Prior to Admission medications   Medication Sig Start Date End Date Taking? Authorizing Provider  ALPRAZolam Prudy Feeler) 0.5 MG tablet Take 0.25-0.5 mg by mouth daily as needed. 10/22/21  Yes [provider]  carvedilol (COREG) 6.25 MG tablet Take 1 tablet (6.25 mg total) by mouth 2 (two) times daily with a meal. 12/02/19  Yes Azucena Fallen, MD  ezetimibe (ZETIA) 10 MG tablet Take 10 mg by mouth daily. 10/23/19  Yes [provider]  levothyroxine (SYNTHROID) 100 MCG tablet Take 100 mcg by mouth daily. 03/19/22  Yes [provider]  Evolocumab (REPATHA SURECLICK) 140 MG/ML SOAJ Inject 140 mg into the skin every 14 (fourteen) days. 12/09/22   Croitoru, Mihai, MD    Current Outpatient Medications  Medication Sig Dispense Refill   ALPRAZolam (XANAX) 0.5 MG tablet Take 0.25-0.5 mg by mouth daily as needed.     carvedilol (COREG) 6.25 MG tablet Take 1 tablet (6.25 mg total) by mouth 2 (two) times daily with a meal. 60 tablet 0   ezetimibe (ZETIA) 10 MG tablet Take 10 mg by mouth daily.     levothyroxine (SYNTHROID) 100 MCG tablet Take 100 mcg by mouth daily.     Evolocumab (REPATHA SURECLICK) 140 MG/ML SOAJ Inject 140 mg into the skin every 14 (fourteen) days. 2 mL 11   Current Facility-Administered Medications  Medication Dose Route Frequency Provider Last Rate Last Admin   0.9 %  sodium  chloride infusion  500 mL Intravenous Once Meryl Dare, MD        Allergies as of 02/15/2023 - Review Complete 02/15/2023  Allergen Reaction Noted   Clindamycin/lincomycin Swelling 09/24/2015    Family History  Problem Relation Age of Onset   Heart disease Father    Deep vein thrombosis Sister    Colon cancer Neg Hx    Colon polyps Neg Hx    Esophageal cancer Neg Hx    Rectal cancer Neg Hx    Stomach cancer Neg Hx     Social History   Socioeconomic History   Marital status: Married    Spouse name: Not on file   Number of children: Not on file   Years of education: Not on file   Highest education level: Not on file  Occupational History   Not on file  Tobacco Use   Smoking status: Never   Smokeless tobacco: Never  Vaping Use   Vaping status: Never Used  Substance and Sexual Activity   Alcohol use: Yes    Comment: occ   Drug use: Yes    Types: Marijuana   Sexual activity: Not on file  Other Topics Concern   Not on file  Social History Narrative   Are you right handed or left handed? Right handed    Are you currently employed ? No    What is  your current occupation? na   Do you live at home alone? No    Who lives with you? Family    What type of home do you live in: 1 story or 2 story? 2 story no many steps 15 steps       Social Determinants of Health   Financial Resource Strain: Not on file  Food Insecurity: Not on file  Transportation Needs: Not on file  Physical Activity: Not on file  Stress: Not on file  Social Connections: Not on file  Intimate Partner Violence: Not on file    Review of Systems:  All systems reviewed were negative except where noted in HPI.   Physical Exam:  General:  Alert, well-developed, in NAD Head:  Normocephalic and atraumatic. Eyes:  Sclera clear, no icterus.   Conjunctiva pink. Ears:  Normal auditory acuity. Mouth:  No deformity or lesions.  Neck:  Supple; no masses. Lungs:  Clear throughout to auscultation.    No wheezes, crackles, or rhonchi.  Heart:  Regular rate and rhythm; no murmurs. Abdomen:  Soft, nondistended, nontender. No masses, hepatomegaly. No palpable masses.  Normal bowel sounds.    Rectal:  Deferred   Msk:  Symmetrical without gross deformities. Extremities:  Without edema. Neurologic:  Alert and  oriented x 4; grossly normal neurologically. Skin:  Intact without significant lesions or rashes. Psych:  Alert and cooperative. Normal mood and affect.   [name]. [name]  02/15/2023, 10:41 AM See Loretha Stapler, [facility], to contact our on call provider

## 2023-02-16 ENCOUNTER — Telehealth: Payer: Self-pay

## 2023-02-16 NOTE — Telephone Encounter (Signed)
  Follow up Call-     02/15/2023    9:53 AM 02/15/2023    9:46 AM  Call back number  Post procedure Call Back phone  # [phone]  Permission to leave phone message Yes      Patient questions:  Do you have a fever, pain , or abdominal swelling? No. Pain Score  0 *  Have you tolerated food without any problems? Yes.    Have you been able to return to your normal activities? Yes.    Do you have any questions about your discharge instructions: Diet   No. Medications  No. Follow up visit  No.  Do you have questions or concerns about your Care? No.  Actions: * If pain score is 4 or above: No action needed, pain <4.

## 2023-02-17 LAB — SURGICAL PATHOLOGY

## 2023-02-20 ENCOUNTER — Encounter: Payer: Self-pay | Admitting: Gastroenterology

## 2023-03-15 DIAGNOSIS — E7800 Pure hypercholesterolemia, unspecified: Secondary | ICD-10-CM | POA: Diagnosis not present

## 2023-03-23 DIAGNOSIS — Z1231 Encounter for screening mammogram for malignant neoplasm of breast: Secondary | ICD-10-CM | POA: Diagnosis not present

## 2023-03-23 DIAGNOSIS — Z124 Encounter for screening for malignant neoplasm of cervix: Secondary | ICD-10-CM | POA: Diagnosis not present

## 2023-03-23 DIAGNOSIS — Z13820 Encounter for screening for osteoporosis: Secondary | ICD-10-CM | POA: Diagnosis not present

## 2023-03-23 DIAGNOSIS — Z6820 Body mass index (BMI) 20.0-20.9, adult: Secondary | ICD-10-CM | POA: Diagnosis not present

## 2023-03-23 DIAGNOSIS — Z01419 Encounter for gynecological examination (general) (routine) without abnormal findings: Secondary | ICD-10-CM | POA: Diagnosis not present

## 2023-03-29 NOTE — Progress Notes (Deleted)
Cardiology Office Note:    Date:  03/29/2023   ID:  Carianne Handcock, DOB 02-Dec-1955, MRN [id number]  PCP:  Mosetta Putt, MD   [facility] Providers Cardiologist:  None { Click to update primary MD,subspecialty MD or APP then REFRESH:1}    Referring MD: Mosetta Putt, MD   No chief complaint on file. ***  History of Present Illness:    Kathleen [name] is a 67 y.o. female with a hx of severe hypercholesterolemia suspicious for familial hypercholesterolemia, intolerant to multiple statins due to severe myalgia, and hypertension.  She has a family history of CAD and she uses marijuana.  She was seen in the ER 05/04/2022 for seizure-like activity after not taking Xanax for several days.  She had outpatient follow-up with neurology.  Outpatient EEG and MRI brain unrevealing.  She was last seen by Dr. Royann Shivers 04/06/2022 and was doing well at that time, no cardiac complaints.  Her hyperlipidemia is controlled with Repatha and Zetia.  Hypertension controlled with carvedilol.  She does have high iatrogenic hyperthyroidism on levothyroxine.  She presents today for routine follow-up.    Hyperlipidemia - Continue Repatha and Zetia - Last LDL was 53   Hypertension - Controlled with carvedilol, diet and exercise     Past Medical History:  Diagnosis Date   Clostridium difficile infection    Hyperlipidemia    Hypertension    Seizures (HCC) 05/2022   Thyroid disease     Past Surgical History:  Procedure Laterality Date   ANKLE FRACTURE SURGERY     CERVICAL CONE BIOPSY      Current Medications: No outpatient medications have been marked as taking for the 04/10/23 encounter (Appointment) with Marcelino Duster, PA.     Allergies:   Clindamycin/lincomycin   Social History   Socioeconomic History   Marital status: Married    Spouse name: Not on file   Number of children: Not on file   Years of education: Not on file   Highest education level: Not on file   Occupational History   Not on file  Tobacco Use   Smoking status: Never   Smokeless tobacco: Never  Vaping Use   Vaping status: Never Used  Substance and Sexual Activity   Alcohol use: Yes    Comment: occ   Drug use: Yes    Types: Marijuana   Sexual activity: Not on file  Other Topics Concern   Not on file  Social History Narrative   Are you right handed or left handed? Right handed    Are you currently employed ? 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 na   Do you live at home alone? No    Who lives with you? Family    What type of home do you live in: 1 story or 2 story? 2 story no many steps 15 steps       Social Determinants of Health   Financial Resource Strain: Not on file  Food Insecurity: Not on file  Transportation Needs: Not on file  Physical Activity: Not on file  Stress: Not on file  Social Connections: Not on file     Family History: The patient's ***family history includes Deep vein thrombosis in her sister; Heart disease in her father. There is no history of Colon cancer, Colon polyps, Esophageal cancer, Rectal cancer, or Stomach cancer.  ROS:   Please see the history of present illness.    *** All other systems reviewed and are negative.  EKGs/Labs/Other Studies Reviewed:    The following studies were reviewed today: ***      Recent Labs: 05/04/2022: BUN 7; Creatinine, Ser 0.55; Hemoglobin 13.8; Platelets 166; Potassium 3.3; Sodium 129  Recent Lipid Panel    Component Value Date/Time   CHOL 263 (H) 02/19/2019 1040   TRIG 86 02/19/2019 1040   HDL 54 02/19/2019 1040   CHOLHDL 4.9 (H) 02/19/2019 1040   CHOLHDL 4 07/20/2016 0943   VLDL 19.2 07/20/2016 0943   LDLCALC 194 (H) 02/19/2019 1040     Risk Assessment/Calculations:   {Does this patient have ATRIAL FIBRILLATION?:[id number]}  No BP recorded.  {Refresh Note OR Click here to enter BP  :1}***         Physical Exam:    VS:  There were no vitals taken for this visit.    Wt Readings  from Last 3 Encounters:  02/15/23 125 lb (56.7 kg)  01/16/23 125 lb (56.7 kg)  05/16/22 135 lb (61.2 kg)     GEN: *** Well nourished, well developed in no acute distress HEENT: Normal NECK: No JVD; No carotid bruits LYMPHATICS: No lymphadenopathy CARDIAC: ***RRR, no murmurs, rubs, gallops RESPIRATORY:  Clear to auscultation without rales, wheezing or rhonchi  ABDOMEN: Soft, non-tender, non-distended MUSCULOSKELETAL:  No edema; No deformity  SKIN: Warm and dry NEUROLOGIC:  Alert and oriented x 3 PSYCHIATRIC:  Normal affect   ASSESSMENT:    No diagnosis found. PLAN:    In order of problems listed above:  ***      {Are you ordering a CV Procedure (e.g. stress test, cath, DCCV, TEE, etc)?   Press F2        :[id number]}    Medication Adjustments/Labs and Tests Ordered: Current medicines are reviewed at length with the patient today.  Concerns regarding medicines are outlined above.  No orders of the defined types were placed in this encounter.  No orders of the defined types were placed in this encounter.   There are no Patient Instructions on file for this visit.   Signed, Marcelino Duster, PA  03/29/2023 11:18 AM    [facility]-Continue Repatha and Zetia - Last LDL-C was 53

## 2023-04-10 ENCOUNTER — Ambulatory Visit: Payer: BC Managed Care – PPO | Attending: Physician Assistant | Admitting: Emergency Medicine

## 2023-04-10 ENCOUNTER — Encounter: Payer: Self-pay | Admitting: Physician Assistant

## 2023-04-10 VITALS — BP 188/80 | HR 61 | Ht 66.5 in | Wt 125.0 lb

## 2023-04-10 DIAGNOSIS — E7801 Familial hypercholesterolemia: Secondary | ICD-10-CM | POA: Diagnosis not present

## 2023-04-10 DIAGNOSIS — I6523 Occlusion and stenosis of bilateral carotid arteries: Secondary | ICD-10-CM

## 2023-04-10 DIAGNOSIS — I10 Essential (primary) hypertension: Secondary | ICD-10-CM | POA: Diagnosis not present

## 2023-04-10 DIAGNOSIS — E0580 Other thyrotoxicosis without thyrotoxic crisis or storm: Secondary | ICD-10-CM | POA: Diagnosis not present

## 2023-04-10 MED ORDER — AMLODIPINE BESYLATE 5 MG PO TABS: 5.0000 mg | ORAL_TABLET | Freq: Every day | ORAL | 3 refills | Status: DC

## 2023-04-10 NOTE — Patient Instructions (Signed)
Medication Instructions:  Start Amlodipine 5 mg daily Continue all other nmedications *If you need a refill on your cardiac medications before your next appointment, please call your pharmacy*   Lab Work: None ordered   Testing/Procedures: None ordered   Follow-Up: At [facility], you and your health needs are our priority.  As part of our continuing mission to provide you with exceptional heart care, we have created designated Provider Care Teams.  These Care Teams include your primary Cardiologist (physician) and Advanced Practice Providers (APPs -  Physician Assistants and Nurse Practitioners) who all work together to provide you with the care you need, when you need it.  We recommend signing up for the patient portal called "MyChart".  Sign up information is provided on this After Visit Summary.  MyChart is used to connect with patients for Virtual Visits (Telemedicine).  Patients are able to view lab/test results, encounter notes, upcoming appointments, etc.  Non-urgent messages can be sent to your provider as well.   To learn more about what you can do with MyChart, go to [email or web address].    Your next appointment:  3 months    Provider:  Dr.Croitoru      Check blood pressure daily if blood pressure greater than 140 call office

## 2023-04-10 NOTE — Progress Notes (Addendum)
Cardiology Office Note:    Date:  04/10/2023  ID:  Adlin Ohagan, DOB Dec 17, 1955, MRN [id number] PCP: Mosetta Putt, MD  [facility] Providers Cardiologist:  None       Patient Profile:      Kathleen [name] is a 67 year old female with history of severe hypercholesteremia, carotid artery disease, hypothyroidism, heterozygous familial hypercholesteremia, hypertension.   She established with Cardiology on 02/27/2014 with Dr. Royann Shivers for hypercholesteremia and reduced exercise tolerance. She presented with severely elevated LDL at 194 and total cholesterol at 263 and family history of early onset CAD.  She is intolerant to simvastatin, rosuvastatin, atorvastatin due to severe myalgia.  Exercise treadmill test performed 03/20/2014 was normal with no evidence of ischemia. Carotid duplex on 08/12/19 showed left ICA are consistent with a 40-59% stenosis and right ICA consistent with 1-39% stenosis.  Repeat carotid duplex on 01/24/2022 shows right and left ICA consistent with 1-39% stenosis.  Now on Repatha, she had a marked improvement in LDL cholesterol, but LDL did not really reach target so Zetia 10 mg was started.       History of Present Illness:   Kathleen [name] is a 67 y.o. female who returns for her 1 year follow-up.  She is doing well from a cardiac perspective.  She has had no cardiovascular complaints or concerns over the past year.  She notes compliance with her daily medication regimen and no noted side effects.  She notes that her stress levels are under better control.  She notes that she used to take her blood pressure daily with average readings of 130s to 140s.  She does note that she often times at home feels "jittery" which she equates to her blood pressure being high.  She has attempted to control her hypertension in the past with sodium restriction and exercise.  She does continue to exercise and does note that she eats healthy overall.  She does deny chest pain on exertion,  DOE, orthopnea, PND, syncope, palpitations, claudication, leg swelling, unexplained weight gain, hemoptysis.         Review of Systems  Constitutional: Negative for weight gain and weight loss.  Cardiovascular:  Negative for chest pain, claudication, cyanosis, dyspnea on exertion, irregular heartbeat, leg swelling, near-syncope, orthopnea, palpitations, paroxysmal nocturnal dyspnea and syncope.  Respiratory:  Negative for cough, hemoptysis and shortness of breath.   Gastrointestinal:  Negative for abdominal pain, hematochezia and melena.  Genitourinary:  Negative for hematuria.     See HPI    Studies Reviewed:   EKG Interpretation Date/Time:  Monday April 10 2023 10:34:08 EST Ventricular Rate:  61 PR Interval:  148 QRS Duration:  84 QT Interval:  402 QTC Calculation: 404 R Axis:   49  Text Interpretation: Normal sinus rhythm Normal ECG Confirmed by Rise Paganini [phone]) on 04/10/2023 11:09:36 AM    VAS US Carotid 01/24/2022 Right Carotid: Velocities in the right ICA are consistent with a 1-39%  stenosis.  Left Carotid: Velocities in the left ICA are consistent with a 1-39%  stenosis.   Risk Assessment/Calculations:     HYPERTENSION CONTROL Vitals:   04/10/23 1028 04/10/23 1122  BP: (!) 180/84 (!) 188/80    The patient's blood pressure is elevated above target today.  In order to address the patient's elevated BP: A new medication was prescribed today.          Physical Exam:   VS:  BP (!) 188/80 (BP Location: Left Arm, Patient Position: Sitting)   Pulse 61  Ht 5' 6.5" (1.689 m)   Wt 125 lb (56.7 kg)   BMI 19.87 kg/m    Wt Readings from Last 3 Encounters:  04/10/23 125 lb (56.7 kg)  02/15/23 125 lb (56.7 kg)  01/16/23 125 lb (56.7 kg)    Constitutional:      Appearance: Normal and healthy appearance.  HENT:     Head: Normocephalic.  Neck:     Vascular: JVD normal.  Pulmonary:     Effort: Pulmonary effort is normal.     Breath sounds: Normal breath  sounds.  Chest:     Chest wall: Not tender to palpatation.  Cardiovascular:     PMI at left midclavicular line. Normal rate. Regular rhythm. Normal S1. Normal S2.      Murmurs: There is no murmur.     No gallop.  No click. No rub.  Pulses:    Intact distal pulses.  Edema:    Peripheral edema absent.  Musculoskeletal: Normal range of motion.     Cervical back: Normal range of motion and neck supple. Skin:    General: Skin is warm and dry.  Neurological:     General: No focal deficit present.     Mental Status: Alert, oriented to person, place, and time and oriented to person, place and time.  Psychiatric:        Attention and Perception: Attention and perception normal.        Mood and Affect: Mood normal.        Behavior: Behavior is cooperative.        Thought Content: Thought content normal.        Assessment and Plan:  Heterozygous familial hypercholesteremia / HLD -LDL-C 40 on 01/18/2023.  Under excellent control with goal less than 70 -Continue Repatha and ezetimibe.  Denies side effects. -Encouraged heart healthy dieting.  Increase fiber, vegetables, fruit.  Decrease processed foods and foods high in sugar.  HTN / whitecoat hypertension -BP today 180/84. Blood pressures at home consistently 130s-140s -Her previous readings in our clinic ranged from 160s to 180s over several years.  She has history of whitecoat hypertension -Plan to add amlodipine 5 mg once daily to reach goal of less than 130/80. Continue carvedilol 6.25 mg twice daily -She is to do daily blood pressure log and send her results to office if her blood pressure stays consistently over 140s systolic. Bring BP cuff into office during next visit  Carotid artery disease  -VAS US carotid 01/20/2022 right and left ICA consistent with 1-39% stenosis -Asymptomatic.  Continue to monitor -Lipids under good control  Hypothyroidism -Follows with PCP TSH 0.81 on 01/18/2023               Dispo:  Return in about 3  months (around 07/09/2023).  Signed, Denyce Robert, NP

## 2023-04-18 ENCOUNTER — Telehealth: Payer: Self-pay | Admitting: Pharmacy Technician

## 2023-04-18 ENCOUNTER — Other Ambulatory Visit (HOSPITAL_COMMUNITY): Payer: Self-pay

## 2023-04-18 NOTE — Telephone Encounter (Signed)
Pharmacy Patient Advocate Encounter   Received notification from CoverMyMeds that prior authorization for repatha is required/requested.   Insurance verification completed.   The patient is insured through [facility] .   Per test claim: PA required; PA submitted to above mentioned insurance via CoverMyMeds Key/confirmation #/EOC [id number] Status is pending

## 2023-04-21 NOTE — Telephone Encounter (Signed)
Pharmacy Patient Advocate Encounter  Received notification from [facility] that Prior Authorization for repatha has been APPROVED from 04/18/23 to 04/17/24   PA #/Case ID/Reference #: [id number]

## 2023-07-26 ENCOUNTER — Encounter: Payer: Self-pay | Admitting: Emergency Medicine

## 2023-07-26 ENCOUNTER — Ambulatory Visit: Attending: Emergency Medicine | Admitting: Emergency Medicine

## 2023-07-26 VITALS — BP 142/70 | HR 59 | Ht 66.5 in | Wt 132.0 lb

## 2023-07-26 DIAGNOSIS — I10 Essential (primary) hypertension: Secondary | ICD-10-CM | POA: Diagnosis not present

## 2023-07-26 DIAGNOSIS — E0580 Other thyrotoxicosis without thyrotoxic crisis or storm: Secondary | ICD-10-CM | POA: Diagnosis not present

## 2023-07-26 DIAGNOSIS — I6523 Occlusion and stenosis of bilateral carotid arteries: Secondary | ICD-10-CM

## 2023-07-26 DIAGNOSIS — E7800 Pure hypercholesterolemia, unspecified: Secondary | ICD-10-CM | POA: Diagnosis not present

## 2023-07-26 DIAGNOSIS — E7801 Familial hypercholesterolemia: Secondary | ICD-10-CM

## 2023-07-26 LAB — LIPID PANEL
Chol/HDL Ratio: 1.7 ratio (ref 0.0–4.4)
Cholesterol, Total: 164 mg/dL (ref 100–199)
HDL: 97 mg/dL (ref >39)
LDL Chol Calc (NIH): 55 mg/dL (ref 0–99)
Triglycerides: 57 mg/dL (ref 0–149)
VLDL Cholesterol Cal: 12 mg/dL (ref 5–40)

## 2023-07-26 LAB — COMPREHENSIVE METABOLIC PANEL
ALT: 13 IU/L (ref 0–32)
AST: 14 IU/L (ref 0–40)
Albumin: 4.8 g/dL (ref 3.9–4.9)
Alkaline Phosphatase: 56 IU/L (ref 44–121)
BUN/Creatinine Ratio: 10 — ABNORMAL LOW (ref 12–28)
BUN: 8 mg/dL (ref 8–27)
Bilirubin Total: 0.7 mg/dL (ref 0.0–1.2)
CO2: 22 mmol/L (ref 20–29)
Calcium: 9.5 mg/dL (ref 8.7–10.3)
Chloride: 97 mmol/L (ref 96–106)
Creatinine, Ser: 0.77 mg/dL (ref 0.57–1.00)
Globulin, Total: 2.1 g/dL (ref 1.5–4.5)
Glucose: 83 mg/dL (ref 70–99)
Potassium: 4.1 mmol/L (ref 3.5–5.2)
Sodium: 134 mmol/L (ref 134–144)
Total Protein: 6.9 g/dL (ref 6.0–8.5)
eGFR: 84 mL/min/{1.73_m2} (ref >59)

## 2023-07-26 MED ORDER — AMLODIPINE BESYLATE 10 MG PO TABS: 10.0000 mg | ORAL_TABLET | Freq: Every day | ORAL | 3 refills | Status: AC

## 2023-07-26 NOTE — Progress Notes (Signed)
 Cardiology Office Note:    Date:  07/26/2023  ID:  Kathleen [name], DOB 25-Aug-1955, MRN [id number] PCP: Mosetta Putt, MD  [facility] Providers Cardiologist:  Thurmon Fair, MD       Patient Profile:      Chief Complaint: 18-month follow-up for hypertension  History of Present Illness:  Kathleen [name] is a 68 y.o. female with visit-pertinent history of severe hypercholesteremia, carotid artery disease, hypothyroidism, heterozygous familial hypercholesteremia, hypertension.    She established with Cardiology on 02/27/2014 with Dr. Royann Shivers for hypercholesteremia and reduced exercise tolerance. She presented with severely elevated LDL at 194 and total cholesterol at 263 and family history of early onset CAD.  She is intolerant to simvastatin, rosuvastatin, atorvastatin due to severe myalgia.  Exercise treadmill test performed 03/20/2014 was normal with no evidence of ischemia. Carotid duplex on 08/12/19 showed left ICA are consistent with a 40-59% stenosis and right ICA consistent with 1-39% stenosis.  Repeat carotid duplex on 01/24/2022 shows right and left ICA consistent with 1-39% stenosis.  Now on Repatha, she had a marked improvement in LDL cholesterol, but LDL did not really reach target so Zetia 10 mg was started.   She was last seen in clinic on 04/10/2023.  She is without acute cardiovascular concerns or complaints.  Her blood pressure was elevated in clinic at 180/84.  She was started on amlodipine 5 mg daily and continued on carvedilol 6.25 mg twice daily.  She was to monitor blood pressure at home and follow-up in 3 months.  Her LDL cholesterol is under excellent control at 40 on 01/2023.   Discussed the use of AI scribe software for clinical note transcription with the patient, who gave verbal consent to proceed.  Today the patient presents for a follow-up visit for hypertension.  She is without acute cardiovascular concerns or complaints at this time.  She is currently taking  carvedilol 6.25 mg twice daily twice daily and amlodipine 5 mg once daily for blood pressure control and Repatha and Zetia for cholesterol management.  The patient has been monitoring her blood pressure at home and has noticed some variability in the readings, with some readings in the 140s to 160s and other readings in the 110s. She also reports taking her medications around 10 o'clock in the morning.  It seems her daily average blood pressure is currently in the 140s which is improved from prior blood pressures in the 180s.  She remains active, walking her dogs for two miles daily.  She has a home in the [employer] which is currently being renovated.  She enjoys going during summertime.  She is without any exertional angina, chest pain, dyspnea, orthopnea, PND, leg swelling, palpitations, claudication, lightheadedness, dizziness, syncope, presyncope.    Review of systems:  Please see the history of present illness. All other systems are reviewed and otherwise negative.     Home Medications:    Current Meds  Medication Sig   ALPRAZolam (XANAX) 0.5 MG tablet Take 0.25-0.5 mg by mouth daily as needed.   amLODipine (NORVASC) 10 MG tablet Take 1 tablet (10 mg total) by mouth daily.   carvedilol (COREG) 6.25 MG tablet Take 1 tablet (6.25 mg total) by mouth 2 (two) times daily with a meal.   Evolocumab (REPATHA SURECLICK) 140 MG/ML SOAJ Inject 140 mg into the skin every 14 (fourteen) days.   ezetimibe (ZETIA) 10 MG tablet Take 10 mg by mouth daily.   levothyroxine (SYNTHROID) 100 MCG tablet Take 100 mcg by mouth daily.   [  DISCONTINUED] amLODipine (NORVASC) 5 MG tablet Take 1 tablet (5 mg total) by mouth daily.   Studies Reviewed:       VAS US Carotid 01/24/2022 Right Carotid: Velocities in the right ICA are consistent with a 1-39%  stenosis.  Left Carotid: Velocities in the left ICA are consistent with a 1-39%  stenosis.  Risk Assessment/Calculations:     HYPERTENSION CONTROL Vitals:    07/26/23 0908 07/26/23 1000  BP: (!) 146/70 (!) 142/70    The patient's blood pressure is elevated above target today.  In order to address the patient's elevated BP: A current anti-hypertensive medication was adjusted today.          Physical Exam:   VS:  BP (!) 142/70 (BP Location: Left Arm, Patient Position: Sitting, Cuff Size: Normal)   Pulse (!) 59   Ht 5' 6.5" (1.689 m)   Wt 132 lb (59.9 kg)   BMI 20.99 kg/m    Wt Readings from Last 3 Encounters:  07/26/23 132 lb (59.9 kg)  04/10/23 125 lb (56.7 kg)  02/15/23 125 lb (56.7 kg)    GEN: Well nourished, well developed in no acute distress NECK: No JVD; No carotid bruits CARDIAC: RRR, no murmurs, rubs, gallops RESPIRATORY:  Clear to auscultation without rales, wheezing or rhonchi  ABDOMEN: Soft, non-tender, non-distended EXTREMITIES:  No edema; No acute deformity     Assessment and Plan:  Heterozygous familial hypercholesteremia / HLD LDL-C 40 on 01/2023.  Under excellent control with goal <70 - Will repeat lipid panel and CMET today - Continue Repatha and ezetimibe.  Denies side effects, no myalgias  HTN / whitecoat hypertension BP today was 146/70 and repeat 142/70.  Average blood pressures at home are fairly inconsistent.  Ranges from 110s to 160s systolic.  Her average home BP is 140s systolic  - Blood pressure seemed to improve with addition of amlodipine. Prior BPs in 180s systolic.  - Today I will increase Amlodipine to 10 mg daily to reach goal of less than 130/80. Continue Carvedilol 6.25 mg twice daily - She will monitor blood pressure at home and notify office for consistent blood pressures greater than 140/80.   Carotid artery disease  VAS US carotid 01/20/2022 right and left ICA consistent with 1-39% stenosis - Overall asymptomatic today and has been managed by primary care physician - Lipids under excellent control currently   Hyperthyroidism (iatrogenic) TSH 0.81 on 01/18/2023 - Managed by PCP            Dispo:  Return in about 6 months (around 01/26/2024).  Signed, Denyce Robert, NP

## 2023-07-26 NOTE — Patient Instructions (Addendum)
 Medication Instructions:  INCREASE YOUR AMLODIPINE TO 10 MG DAILY. TAKE YOUR BLOOD PRESSURE DAILY AND RECORD IT.   Lab Work: FASTING LIPID PANEL AND CMET TO BE DONE TODAY.    Testing/Procedures: NONE   Follow-Up: At [facility], you and your health needs are our priority.  As part of our continuing mission to provide you with exceptional heart care, we have created designated Provider Care Teams.  These Care Teams include your primary Cardiologist (physician) and Advanced Practice Providers (APPs -  Physician Assistants and Nurse Practitioners) who all work together to provide you with the care you need, when you need it.   Your next appointment:   6 MONTHS  Provider:   DR. Salena Saner OR [place],DNP  Other Instructions:

## 2023-12-17 ENCOUNTER — Other Ambulatory Visit: Payer: Self-pay | Admitting: Cardiovascular Disease

## 2023-12-25 ENCOUNTER — Telehealth: Payer: Self-pay | Admitting: Cardiovascular Disease

## 2023-12-25 ENCOUNTER — Other Ambulatory Visit: Payer: Self-pay | Admitting: Emergency Medicine

## 2023-12-25 NOTE — Telephone Encounter (Signed)
 For review-Are we managing/ok to fill??

## 2023-12-25 NOTE — Telephone Encounter (Signed)
 error

## 2023-12-27 ENCOUNTER — Other Ambulatory Visit (HOSPITAL_COMMUNITY): Payer: Self-pay

## 2023-12-27 MED ORDER — CARVEDILOL 6.25 MG PO TABS
6.2500 mg | ORAL_TABLET | Freq: Two times a day (BID) | ORAL | 2 refills | Status: DC
  Filled 2023-12-27: qty 180, 90d supply, fill #0

## 2024-01-16 ENCOUNTER — Ambulatory Visit: Attending: Cardiovascular Disease | Admitting: Cardiovascular Disease

## 2024-01-16 ENCOUNTER — Encounter: Payer: Self-pay | Admitting: Cardiovascular Disease

## 2024-01-16 VITALS — BP 139/70 | HR 50 | Ht 67.5 in | Wt 123.1 lb

## 2024-01-16 DIAGNOSIS — I10 Essential (primary) hypertension: Secondary | ICD-10-CM | POA: Diagnosis not present

## 2024-01-16 DIAGNOSIS — E039 Hypothyroidism, unspecified: Secondary | ICD-10-CM

## 2024-01-16 DIAGNOSIS — E7801 Familial hypercholesterolemia: Secondary | ICD-10-CM

## 2024-01-16 MED ORDER — CARVEDILOL 3.125 MG PO TABS: 3.1250 mg | ORAL_TABLET | Freq: Two times a day (BID) | ORAL | 3 refills | Status: AC

## 2024-01-16 NOTE — Patient Instructions (Signed)
 Medication Instructions:  Decrease Carvedilol  to 3.125 mg twice daily *If you need a refill on your cardiac medications before your next appointment, please call your pharmacy*  Lab Work: None ordered If you have labs (blood work) drawn today and your tests are completely normal, you will receive your results only by: MyChart Message (if you have MyChart) OR A paper copy in the mail If you have any lab test that is abnormal or we need to change your treatment, we will call you to review the results.  Testing/Procedures: None ordered  Follow-Up: At [facility], you and your health needs are our priority.  As part of our continuing mission to provide you with exceptional heart care, our providers are all part of one team.  This team includes your primary Cardiologist (physician) and Advanced Practice Providers or APPs (Physician Assistants and Nurse Practitioners) who all work together to provide you with the care you need, when you need it.  Your next appointment:   1 year(s)  Provider:   Jerel Balding, MD    We recommend signing up for the patient portal called MyChart.  Sign up information is provided on this After Visit Summary.  MyChart is used to connect with patients for Virtual Visits (Telemedicine).  Patients are able to view lab/test results, encounter notes, upcoming appointments, etc.  Non-urgent messages can be sent to your provider as well.   To learn more about what you can do with MyChart, go to [email or web address].

## 2024-01-16 NOTE — Progress Notes (Unsigned)
 Cardiology Office Note    Date:  01/18/2024   ID:  Anicka Stuckert, DOB 1956-02-07, MRN [id number]   PCP:  Windy Coy, MD  Cardiologist:  Jerel Balding, MD Yarissa Reining Electrophysiologist:  None   Evaluation Performed:  Follow-Up Visit  Chief Complaint: Hypercholesterolemia  History of Present Illness:    Kathleen [name] is a 68 y.o. female with severe hypercholesterolemia, almost certainly heterozygous familial hypercholesterolemia, intolerant to multiple statins due to severe myalgia, hypertension controlled with sodium restriction and exercise.  Severely elevated LDL cholesterol baseline 194 and family history of early onset CAD (father age 74 had myocardial infarction).  On carvedilol  for hypertension.  Does not have diabetes mellitus and does not smoke.  For the most she is doing well, but often her blood pressure at home is typically 110-120/50-60s.  Occasionally feels dizzy.  Also has a slow heart rate 50 bpm.  This does not stop her from doing household chores.  Her blood pressure is always higher in the doctor's office.  The patient specifically denies any chest pain at rest or with exertion, dyspnea at rest or with exertion, orthopnea, paroxysmal nocturnal dyspnea, syncope, palpitations, focal neurological deficits, intermittent claudication, lower extremity edema, unexplained weight gain, cough, hemoptysis or wheezing.  Most recent lipid profile from 07/26/2023 shows an excellent LDL cholesterol 55.  Repatha  monotherapy is not sufficient, so she is also taking ezetimibe.  This recent thyroid  test showed she is now euthyroid after adjustment to the dose of levothyroxine .    Past Medical History:  Diagnosis Date   Clostridium difficile infection    Hyperlipidemia    Hypertension    Seizures (HCC) 05/2022   Thyroid  disease    Past Surgical History:  Procedure Laterality Date   ANKLE FRACTURE SURGERY     CERVICAL CONE BIOPSY       Current Meds  Medication Sig    ALPRAZolam (XANAX) 0.5 MG tablet Take 0.25-0.5 mg by mouth daily as needed.   amLODipine  (NORVASC ) 10 MG tablet Take 1 tablet (10 mg total) by mouth daily.   Evolocumab  (REPATHA  SURECLICK) 140 MG/ML SOAJ ADMINISTER 1 ML UNDER THE SKIN EVERY 14 DAYS   ezetimibe (ZETIA) 10 MG tablet Take 10 mg by mouth daily.   levothyroxine  (SYNTHROID ) 100 MCG tablet Take 100 mcg by mouth daily.   [DISCONTINUED] carvedilol  (COREG ) 6.25 MG tablet Take 1 tablet (6.25 mg total) by mouth 2 (two) times daily with a meal.     Allergies:   Clindamycin/lincomycin   Social History   Tobacco Use   Smoking status: Never   Smokeless tobacco: Never  Vaping Use   Vaping status: Never Used  Substance Use Topics   Alcohol use: Yes    Comment: occ   Drug use: Yes    Types: Marijuana     Family Hx: The patient's family history includes Deep vein thrombosis in her sister; Heart disease in her father. There is no history of Colon cancer, Colon polyps, Esophageal cancer, Rectal cancer, or Stomach cancer.  ROS:   Please see the history of present illness.     All other systems reviewed and are negative.   Prior CV studies:   The following studies were reviewed today:   Labs/Other Tests and Data Reviewed:    EKG:  No ECG reviewed. Requesting ECG from Dr. Windy.  Recent Labs: 07/26/2023: ALT 13; BUN 8; Creatinine, Ser 0.77; Potassium 4.1; Sodium 134  10/12/2021 Dr. Windy Cholesterol 134, HDL 67 (HDL particle #42), LDL 53 (  LDL particle #776, small LDL particle #342), triglycerides 65 ALT 11, hemoglobin A1c 5.4%, hemoglobin 13.9, creatinine 0.54, potassium 4.1  Recent Lipid Panel Lab Results  Component Value Date/Time   CHOL 164 07/26/2023 10:21 AM   TRIG 57 07/26/2023 10:21 AM   HDL 97 07/26/2023 10:21 AM   CHOLHDL 1.7 07/26/2023 10:21 AM   CHOLHDL 4 07/20/2016 09:43 AM   LDLCALC 55 07/26/2023 10:21 AM    Wt Readings from Last 3 Encounters:  01/16/24 123 lb 1.6 oz (55.8 kg)  07/26/23 132 lb  (59.9 kg)  04/10/23 125 lb (56.7 kg)     Objective:    Vital Signs:  BP 139/70 (BP Location: Left Arm, Patient Position: Sitting, Cuff Size: Normal)   Pulse (!) 50   Ht 5' 7.5 (1.715 m)   Wt 123 lb 1.6 oz (55.8 kg)   SpO2 98%   BMI 19.00 kg/m     General: Alert, oriented x3, no distress, very lean, appears fit Head: no evidence of trauma, PERRL, EOMI, no exophtalmos or lid lag, no myxedema, no xanthelasma; normal ears, nose and oropharynx Neck: normal jugular venous pulsations and no hepatojugular reflux; brisk carotid pulses without delay and no carotid bruits Chest: clear to auscultation, no signs of consolidation by percussion or palpation, normal fremitus, symmetrical and full respiratory excursions Cardiovascular: normal position and quality of the apical impulse, regular rhythm, normal first and second heart sounds, no murmurs, rubs or gallops Abdomen: no tenderness or distention, no masses by palpation, no abnormal pulsatility or arterial bruits, normal bowel sounds, no hepatosplenomegaly Extremities: no clubbing, cyanosis or edema; 2+ radial, ulnar and brachial pulses bilaterally; 2+ right femoral, posterior tibial and dorsalis pedis pulses; 2+ left femoral, posterior tibial and dorsalis pedis pulses; no subclavian or femoral bruits Neurological: grossly nonfocal Psych: Normal mood and affect   ASSESSMENT & PLAN:    1. Heterozygous familial hypercholesterolemia   2. Essential hypertension   3. Acquired hypothyroidism       Familial heterozygous hypercholesterolemia: Lipid parameters are all in target range with combination Repatha  plus ezetimibe.  Repatha  monotherapy was insufficient to achieve target LDL less than 70.  Will have repeat labs with PCP. HTN: As is often the case she has elevated blood pressure in the doctor's office, but at home her diastolic blood pressure has often been in the 50s.  She is also bradycardic.  Reduce the dose of carvedilol , continue the  same dose of amlodipine . Acquired hypothyroidism: Clinically euthyroid and most recent TSH in normal range on the current levothyroxine  dose.   Medication Adjustments/Labs and Tests Ordered: Current medicines are reviewed at length with the patient today.  Concerns regarding medicines are outlined above.   Tests Ordered: Orders Placed This Encounter  Procedures   EKG 12-Lead    Medication Changes: Meds ordered this encounter  Medications   carvedilol  (COREG ) 3.125 MG tablet    Sig: Take 1 tablet (3.125 mg total) by mouth 2 (two) times daily with a meal.    Dispense:  180 tablet    Refill:  3    Follow Up:  1 year  Signed, Jerel Balding, MD  01/18/2024 4:40 PM    [facility]

## 2024-01-18 ENCOUNTER — Telehealth: Payer: Self-pay | Admitting: Cardiovascular Disease

## 2024-01-18 NOTE — Telephone Encounter (Signed)
 Called Pharmacy and they did not receive new RX. I did a phone in order.

## 2024-01-18 NOTE — Telephone Encounter (Signed)
*  STAT* If patient is at the pharmacy, call can be transferred to refill team.   1. Which medications need to be refilled? (please list name of each medication and dose if known) carvedilol  (COREG ) 3.125 MG tablet    2. Would you like to learn more about the convenience, safety, & potential cost savings by using the [facility]?      3. Are you open to using the [facility] (Type [facility]. ).   4. Which pharmacy/location (including street and city if local pharmacy) is medication to be sent to? [id number] - [place], [place] - [address]    5. Do they need a 30 day or 90 day supply? 90 day

## 2024-02-11 LAB — LAB REPORT - SCANNED
A1c: 5.6
EGFR: 94

## 2024-02-29 ENCOUNTER — Ambulatory Visit: Payer: Self-pay | Admitting: Cardiovascular Disease

## 2024-02-29 NOTE — Telephone Encounter (Signed)
 Faxed pt's most recent EKG to PCP as requested

## 2024-05-29 ENCOUNTER — Other Ambulatory Visit: Payer: Self-pay

## 2024-05-29 ENCOUNTER — Encounter: Payer: Self-pay | Admitting: Cardiovascular Disease

## 2024-05-29 NOTE — Telephone Encounter (Signed)
 Please restart the amlodipine  at the previous dose of 10 mg daily
